# Patient Record
Sex: Male | Born: 1965 | Hispanic: No | Marital: Married | Smoking: Never smoker
Health system: Southern US, Community
[De-identification: ages and names within clinical notes are randomized; demographics above are authoritative.]

## PROBLEM LIST (undated history)

## (undated) DIAGNOSIS — M1990 Unspecified osteoarthritis, unspecified site: Secondary | ICD-10-CM

## (undated) DIAGNOSIS — E785 Hyperlipidemia, unspecified: Secondary | ICD-10-CM

## (undated) DIAGNOSIS — N200 Calculus of kidney: Secondary | ICD-10-CM

## (undated) DIAGNOSIS — F32A Depression, unspecified: Secondary | ICD-10-CM

## (undated) DIAGNOSIS — I10 Essential (primary) hypertension: Secondary | ICD-10-CM

## (undated) DIAGNOSIS — K219 Gastro-esophageal reflux disease without esophagitis: Secondary | ICD-10-CM

## (undated) DIAGNOSIS — F419 Anxiety disorder, unspecified: Secondary | ICD-10-CM

## (undated) DIAGNOSIS — R12 Heartburn: Secondary | ICD-10-CM

## (undated) HISTORY — DX: Essential (primary) hypertension: I10

## (undated) HISTORY — DX: Calculus of kidney: N20.0

## (undated) HISTORY — DX: Depression, unspecified: F32.A

## (undated) HISTORY — DX: Hyperlipidemia, unspecified: E78.5

## (undated) HISTORY — DX: Anxiety disorder, unspecified: F41.9

## (undated) HISTORY — DX: Unspecified osteoarthritis, unspecified site: M19.90

## (undated) HISTORY — DX: Heartburn: R12

## (undated) HISTORY — DX: Gastro-esophageal reflux disease without esophagitis: K21.9

## (undated) HISTORY — PX: LITHOTRIPSY: SUR834

## (undated) HISTORY — PX: TONSILLECTOMY: SUR1361

---

## 2009-08-23 ENCOUNTER — Emergency Department (HOSPITAL_COMMUNITY): Admission: EM | Admit: 2009-08-23 | Discharge: 2009-08-23 | Payer: Self-pay | Admitting: Emergency Medicine

## 2009-08-28 ENCOUNTER — Ambulatory Visit (HOSPITAL_COMMUNITY): Admission: RE | Admit: 2009-08-28 | Discharge: 2009-08-28 | Payer: Self-pay | Admitting: Urology

## 2010-06-20 ENCOUNTER — Emergency Department (HOSPITAL_COMMUNITY): Admission: EM | Admit: 2010-06-20 | Discharge: 2010-06-20 | Payer: Self-pay | Admitting: Emergency Medicine

## 2010-10-20 LAB — DIFFERENTIAL
Eosinophils Relative: 6 % — ABNORMAL HIGH (ref 0–5)
Lymphocytes Relative: 24 % (ref 12–46)
Lymphs Abs: 2 10*3/uL (ref 0.7–4.0)
Monocytes Absolute: 0.7 10*3/uL (ref 0.1–1.0)
Monocytes Relative: 9 % (ref 3–12)

## 2010-10-20 LAB — URINALYSIS, ROUTINE W REFLEX MICROSCOPIC
Glucose, UA: NEGATIVE mg/dL
Nitrite: POSITIVE — AB
Specific Gravity, Urine: 1.024 (ref 1.005–1.030)
pH: 5.5 (ref 5.0–8.0)

## 2010-10-20 LAB — COMPREHENSIVE METABOLIC PANEL
AST: 26 U/L (ref 0–37)
Albumin: 4.3 g/dL (ref 3.5–5.2)
Calcium: 9.8 mg/dL (ref 8.4–10.5)
Creatinine, Ser: 1.53 mg/dL — ABNORMAL HIGH (ref 0.4–1.5)
GFR calc Af Amer: >60 mL/min (ref >60)
GFR calc non Af Amer: 50 mL/min — ABNORMAL LOW (ref >60)
Total Protein: 7.3 g/dL (ref 6.0–8.3)

## 2010-10-20 LAB — CBC
MCH: 32.3 pg (ref 26.0–34.0)
MCHC: 34.7 g/dL (ref 30.0–36.0)
Platelets: 336 10*3/uL (ref 150–400)
RDW: 13.6 % (ref 11.5–15.5)

## 2010-10-20 LAB — URINE MICROSCOPIC-ADD ON

## 2010-10-20 LAB — LIPASE, BLOOD: Lipase: 39 U/L (ref 11–59)

## 2010-10-25 LAB — URINALYSIS, ROUTINE W REFLEX MICROSCOPIC
Glucose, UA: NEGATIVE mg/dL
Protein, ur: 100 mg/dL — AB

## 2010-10-25 LAB — URINE MICROSCOPIC-ADD ON

## 2016-08-09 HISTORY — PX: TUMOR REMOVAL: SHX12

## 2017-05-16 NOTE — Progress Notes (Signed)
05/17/2017 3:04 PM   Laurice Record Mcquaig June 12, 1966 [id number]  Referring provider: No referring provider defined for this encounter.  Chief Complaint  Patient presents with  . New Patient (Initial Visit)    Kidney stone referred by Insurance provider list    HPI: Patient is a 51- year old Caucasian male who presents today for nephrolithiasis with girlfriend, [place].    He was having gross hematuria one month ago and was placed on tamsulosin.  He then started having pain in the left back on and off over the weekend.  On Sunday, it became unbearable and he was seen in the ED.  The pain was a 8-9/10.  Nothing made the pain better.  Nothing made the pain worse.  He is not having fevers or chills.  He has had nausea or vomiting.  CT Renal stone study performed at [facility] on 05/15/2017 noted an obstructing 5 mm calculus in the distal left ureter with proximal mild to moderate hydroureteronephrosis as well as associated periureteral and perinephric inflammation  Today, he is having pain in the left flank that is radiating to the left groin.  He feels like "something is trying to get out."    He is having frequency, urgency, dysuria, nocturia, intermittency, hesitancy, straining to urinate, gross hematuria and a weak urinary stream.    UA today is positive for 11-30 RBC's.    He does have a prior history of stones.   He underwent ESWL in 2010.       Reviewed referral notes.    PMH: Past Medical History:  Diagnosis Date  . Heartburn   . HLD (hyperlipidemia)   . HTN (hypertension)   . Kidney stone     Surgical History: Past Surgical History:  Procedure Laterality Date  . LITHOTRIPSY    . TONSILLECTOMY      Home Medications:  Allergies as of 05/17/2017   No Known Allergies     Medication List       Accurate as of 05/17/17  3:04 PM. Always use your most recent med list.          aspirin 81 MG chewable tablet Chew by mouth daily.   cholecalciferol 400 units  Tabs tablet Commonly known as:  VITAMIN D Take 400 Units by mouth once a week.   ondansetron 4 MG tablet Commonly known as:  ZOFRAN Take 4 mg by mouth every 8 (eight) hours as needed for nausea or vomiting.   oxycodone 5 MG capsule Commonly known as:  OXY-IR Take 1 capsule (5 mg total) by mouth every 6 (six) hours as needed.   oxyCODONE-acetaminophen 10-325 MG tablet Commonly known as:  PERCOCET Take 1 tablet by mouth every 4 (four) hours as needed for pain.   simvastatin 10 MG tablet Commonly known as:  ZOCOR Take 10 mg by mouth daily.   tamsulosin 0.4 MG Caps capsule Commonly known as:  FLOMAX Take 1 capsule (0.4 mg total) by mouth daily.       Allergies: No Known Allergies  Family History: Family History  Problem Relation Age of Onset  . Kidney cancer Sister   . Bladder Cancer Neg Hx   . Prostate cancer Neg Hx     Social History:  reports that he has never smoked. He has quit using smokeless tobacco. His smokeless tobacco use included Chew. He reports that he drinks alcohol. He reports that he does not use drugs.  ROS: UROLOGY Frequent Urination?: Yes Hard to postpone urination?: Yes  Burning/pain with urination?: Yes Get up at night to urinate?: Yes Leakage of urine?: No Urine stream starts and stops?: Yes Trouble starting stream?: Yes Do you have to strain to urinate?: Yes Blood in urine?: Yes Urinary tract infection?: No Sexually transmitted disease?: No Injury to kidneys or bladder?: No Painful intercourse?: No Weak stream?: Yes Erection problems?: No Penile pain?: Yes  Gastrointestinal Nausea?: Yes Vomiting?: Yes Indigestion/heartburn?: Yes Diarrhea?: No Constipation?: Yes  Constitutional Fever: Yes Night sweats?: No Weight loss?: No Fatigue?: Yes  Skin Skin rash/lesions?: No Itching?: Yes  Eyes Blurred vision?: Yes Double vision?: No  Ears/Nose/Throat Sore throat?: No Sinus problems?: No  Hematologic/Lymphatic Swollen  glands?: No Easy bruising?: Yes  Cardiovascular Leg swelling?: No Chest pain?: No  Respiratory Cough?: No Shortness of breath?: No  Endocrine Excessive thirst?: No  Musculoskeletal Back pain?: Yes Joint pain?: Yes  Neurological Headaches?: Yes Dizziness?: Yes  Psychologic Depression?: No Anxiety?: No  Physical Exam: BP 133/86   Pulse 62   Temp 98.1 F (36.7 C) (Oral)   Ht '5\' 7"'$  (1.702 m)   Wt 187 lb 11.2 oz (85.1 kg)   BMI 29.40 kg/m   Constitutional: Well nourished. Alert and oriented, No acute distress. HEENT: [place] AT, moist mucus membranes. Trachea midline, no masses. Cardiovascular: No clubbing, cyanosis, or edema. Respiratory: Normal respiratory effort, no increased work of breathing. GI: Abdomen is soft, non tender, non distended, no abdominal masses. Liver and spleen not palpable.  No hernias appreciated.  Stool sample for occult testing is not indicated.   GU: No CVA tenderness.  No bladder fullness or masses.   Skin: No rashes, bruises or suspicious lesions. Lymph: No cervical or inguinal adenopathy. Neurologic: Grossly intact, no focal deficits, moving all 4 extremities. Psychiatric: Normal mood and affect.  Laboratory Data: Urinalysis 11-30 RBC's.  See EPIC.    I have reviewed the labs.   Pertinent Imaging: Name: GEORGIE HAQUE  ID #: [id number]    Exam: CT ABDOMEN PELVIS WO CONTRAST At 3220 Hours  Acc#: [id number] UN   Exam Date: [id number]  Physician: SELF REFERRED     EXAM: CT abdomen and pelvis without contrast DATE: 05/15/2017 4:16 PM ACCESSION: [id number] UN DICTATED: 05/15/2017 4:28 PM INTERPRETATION LOCATION: [place]  CLINICAL INDICATION: 51 years old Male with CALCULUS OF URETER--   COMPARISON: None  TECHNIQUE: A spiral CT scan was obtained without IV contrast from the top of the kidneys through the entire bladder. Images were reconstructed in the axial plane. Coronal and sagittal reformatted images were also provided for  further evaluation.  FINDINGS:Evaluation of the solid organs and vasculature is limited in the absence of intravenous contrast.   LOWER CHEST: Bibasilar subsegmental atelectasis. Coronary artery calcifications..  ABDOMEN/PELVIS:  HEPATOBILIARY: Unremarkable liver. No biliary ductal dilatation. Gallbladder is markedly. PANCREAS: Unremarkable. SPLEEN: Unremarkable. ADRENAL GLANDS: Unremarkable. KIDNEYS/URETERS: 5 mm obstructing calculus in the distal left ureter with proximal mild to moderate hydroureteronephrosis with perinephric and periureteral stranding. There is mild right perinephric stranding without hydronephrosis or calculi.Marland Kitchen BLADDER: Unremarkable. BOWEL/PERITONEUM/RETROPERITONEUM: No bowel obstruction. No ascites. Normal appendix. VASCULATURE: Abdominal aorta within normal limits for patient's age. Unremarkable inferior vena cava. Pelvic phleboliths. Scattered atherosclerotic calcifications. LYMPH NODES: No adenopathy. REPRODUCTIVE ORGANS: Prostatic calcification.  BONES/SOFT TISSUES: No acute osseous abnormality. Tiny left fat-containing hernia.   IMPRESSION: -- Obstructing 5 mm calculus in the distal left ureter with proximal mild to moderate hydroureteronephrosis as well as associated periureteral and perinephric inflammation.      Radiologist: Orlene Erm  I have independently reviewed the films.    Assessment & Plan:    1. Left ureteral stone  - explained to the patient that AUA Guidelines for patients with uncomplicated ureteral stones ?10 mm should be offered observation, and those with distal stones of similar size should be offered MET with ?-blockers - Flomax refilled  - if after 4 to 6 weeks observation with or without MET is not successful we would pursue URS or ESWL, if the patient/clinician decide to intervene sooner based on a shared decision making approach, the clinicians should offer definitive stone treatment  -URS would be the first lined  therapy if stone(s) do not pass, but ESWL is the procedure with the least morbidity and lowest complication rate, but URS has a greater stone-free rate in a single procedure  - Patient had a friend who underwent URS and did not do well and therefore he would like to pursue ESWL as he has had in the pass  - schedule left ESWL  - Patient advised not to take any further NSAIDs or aspirin  - I explained that ESWL is a means of pulverizing urinary stones without surgery using shockwave therapy  - I discussed the risks involved with ESWL consist of bruising to the skin and kidney region as a result of the shockwave, possibility of long-term kidney damage, development of high blood pressure and damage to the bowel or long, hematuria, urinary bleeding serious enough to require transfusion or surgical repair or removal of the kidney is rare, rare chance of hematoma formation in the kidney and injuries to the spleen, liver or pancreas.  There is also the risk of urinary tract infection or any infection of the blood system or tissue.   There is also a possibility of resultant damage to male organs.  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  - Advised to contact our office or seek treatment in the ED if becomes febrile or pain/ vomiting are difficult control in order to arrange for emergent/urgent intervention  2. Left hydronephrosis  - obtain RUS to ensure the hydronephrosis has resolved.    3. Gross hematuria  - UA today demonstrates 11-30 RBC's  - continue to monitor the patient's UA after the  treatment/passage of the stone to ensure the hematuria has resolved  - if hematuria persists, we will pursue a hematuria workup with CT Urogram and cystoscopy if appropriate.  4. Left flank pain  - given script for oxycodone 5 mg IR, # 10 - checked the [place] controlled substance registry - patient had #12 oxycodone 5 mg IR prescribed on 05/15/2017 by Dr. Jadene Pierini - patient advised this is a narcotic and that it is a potential he didn't take his and he can build tolerance up to the medication, he is not to drive, operate heavy machinery or make left changing decisions while taking this medication     Return for left ESWL.  These notes generated with voice recognition software. I apologize for typographical errors.  Zara Council, [facility] [address], [place], [place] [address] [phone]  [phone]

## 2017-05-17 ENCOUNTER — Encounter: Payer: Self-pay | Admitting: Urology

## 2017-05-17 ENCOUNTER — Other Ambulatory Visit: Payer: Self-pay

## 2017-05-17 ENCOUNTER — Ambulatory Visit (INDEPENDENT_AMBULATORY_CARE_PROVIDER_SITE_OTHER): Payer: 59 | Admitting: Urology

## 2017-05-17 VITALS — BP 133/86 | HR 62 | Temp 98.1°F | Ht 67.0 in | Wt 187.7 lb

## 2017-05-17 DIAGNOSIS — N201 Calculus of ureter: Secondary | ICD-10-CM

## 2017-05-17 DIAGNOSIS — R109 Unspecified abdominal pain: Secondary | ICD-10-CM | POA: Diagnosis not present

## 2017-05-17 DIAGNOSIS — R310 Gross hematuria: Secondary | ICD-10-CM | POA: Diagnosis not present

## 2017-05-17 DIAGNOSIS — N132 Hydronephrosis with renal and ureteral calculous obstruction: Secondary | ICD-10-CM | POA: Diagnosis not present

## 2017-05-17 LAB — URINALYSIS, COMPLETE
Bilirubin, UA: NEGATIVE
GLUCOSE, UA: NEGATIVE
Leukocytes, UA: NEGATIVE
NITRITE UA: NEGATIVE
PH UA: 6 (ref 5.0–7.5)
Specific Gravity, UA: 1.025 (ref 1.005–1.030)
UUROB: 0.2 mg/dL (ref 0.2–1.0)

## 2017-05-17 LAB — MICROSCOPIC EXAMINATION
BACTERIA UA: NONE SEEN
EPITHELIAL CELLS (NON RENAL): NONE SEEN /HPF (ref 0–10)

## 2017-05-17 MED ORDER — OXYCODONE HCL 5 MG PO CAPS: 5.0000 mg | ORAL_CAPSULE | Freq: Four times a day (QID) | ORAL | 0 refills | Status: DC | PRN

## 2017-05-17 MED ORDER — TAMSULOSIN HCL 0.4 MG PO CAPS: 0.4000 mg | ORAL_CAPSULE | Freq: Every day | ORAL | 0 refills | Status: DC

## 2017-05-18 LAB — CBC WITH DIFFERENTIAL/PLATELET
BASOS ABS: 0 10*3/uL (ref 0.0–0.2)
BASOS: 0 %
EOS (ABSOLUTE): 0.1 10*3/uL (ref 0.0–0.4)
EOS: 1 %
Hematocrit: 36.9 % — ABNORMAL LOW (ref 37.5–51.0)
Hemoglobin: 12.9 g/dL — ABNORMAL LOW (ref 13.0–17.7)
IMMATURE GRANS (ABS): 0 10*3/uL (ref 0.0–0.1)
Immature Granulocytes: 0 %
LYMPHS: 9 %
Lymphocytes Absolute: 0.8 10*3/uL (ref 0.7–3.1)
MCH: 30.7 pg (ref 26.6–33.0)
MCHC: 35 g/dL (ref 31.5–35.7)
MCV: 88 fL (ref 79–97)
MONOS ABS: 0.6 10*3/uL (ref 0.1–0.9)
Monocytes: 7 %
NEUTROS PCT: 83 %
Neutrophils Absolute: 7.8 10*3/uL — ABNORMAL HIGH (ref 1.4–7.0)
PLATELETS: 260 10*3/uL (ref 150–379)
RBC: 4.2 x10E6/uL (ref 4.14–5.80)
RDW: 13.1 % (ref 12.3–15.4)
WBC: 9.4 10*3/uL (ref 3.4–10.8)

## 2017-05-18 LAB — BASIC METABOLIC PANEL
BUN / CREAT RATIO: 9 (ref 9–20)
BUN: 15 mg/dL (ref 6–24)
CHLORIDE: 99 mmol/L (ref 96–106)
CO2: 24 mmol/L (ref 20–29)
Calcium: 8.5 mg/dL — ABNORMAL LOW (ref 8.7–10.2)
Creatinine, Ser: 1.7 mg/dL — ABNORMAL HIGH (ref 0.76–1.27)
GFR calc Af Amer: 53 mL/min/{1.73_m2} — ABNORMAL LOW (ref >59)
GFR calc non Af Amer: 46 mL/min/{1.73_m2} — ABNORMAL LOW (ref >59)
GLUCOSE: 89 mg/dL (ref 65–99)
POTASSIUM: 3.9 mmol/L (ref 3.5–5.2)
SODIUM: 138 mmol/L (ref 134–144)

## 2017-05-19 SURGERY — Surgical Case
Anesthesia: *Unknown

## 2017-05-20 LAB — CULTURE, URINE COMPREHENSIVE

## 2017-05-25 MED ORDER — CIPROFLOXACIN HCL 500 MG PO TABS
500.0000 mg | ORAL_TABLET | ORAL | Status: DC
Start: 2017-05-25 — End: 2017-05-28

## 2017-05-26 ENCOUNTER — Encounter: Payer: Self-pay | Admitting: *Deleted

## 2017-05-26 ENCOUNTER — Encounter
Admission: RE | Disposition: A | Discharge status: Home / Self Care | Payer: Self-pay | Source: Ambulatory Visit | Attending: Urology

## 2017-05-26 ENCOUNTER — Ambulatory Visit
Admission: RE | Admit: 2017-05-26 | Discharge: 2017-05-26 | Disposition: A | Discharge status: Home / Self Care | Payer: 59 | Source: Ambulatory Visit | Attending: Urology | Admitting: Urology

## 2017-05-26 ENCOUNTER — Ambulatory Visit: Payer: 59

## 2017-05-26 DIAGNOSIS — N200 Calculus of kidney: Secondary | ICD-10-CM | POA: Diagnosis present

## 2017-05-26 DIAGNOSIS — Z538 Procedure and treatment not carried out for other reasons: Secondary | ICD-10-CM | POA: Diagnosis not present

## 2017-05-26 SURGERY — LITHOTRIPSY, ESWL
Anesthesia: Moderate Sedation | Laterality: Left

## 2017-05-26 MED ORDER — SODIUM CHLORIDE 0.9 % IV SOLN: INTRAVENOUS | Status: DC

## 2017-05-26 MED ORDER — DIPHENHYDRAMINE HCL 25 MG PO CAPS: 25.0000 mg | ORAL_CAPSULE | ORAL | Status: AC

## 2017-05-26 MED ORDER — DIAZEPAM 5 MG PO TABS: 10.0000 mg | ORAL_TABLET | ORAL | Status: AC

## 2017-05-26 MED ORDER — ONDANSETRON HCL 4 MG/2ML IJ SOLN: 4.0000 mg | Freq: Once | INTRAMUSCULAR | Status: DC

## 2017-05-26 NOTE — Progress Notes (Signed)
Patient passed stone yesterday evening which he brought with him to preop this AM.  KUB negative, compared to CT scan at [facility]    Case cancelled as such.    Hollice Espy, MD

## 2017-06-14 ENCOUNTER — Other Ambulatory Visit: Payer: Self-pay | Admitting: Urology

## 2017-06-20 ENCOUNTER — Telehealth: Payer: Self-pay | Admitting: Urology

## 2017-06-20 DIAGNOSIS — N200 Calculus of kidney: Secondary | ICD-10-CM

## 2017-06-20 NOTE — Telephone Encounter (Signed)
Would you let Harold Morgan know that his stone analysis was positive for 22% calcium oxalate monohydrate, 63% calcium oxalate dihydrate and 50% calcium phosphate carbonate stone?  A basic prevention plan for kidney stones would be to drink 2.5 L of water daily, avoid sodas, reduce protein and salt in his diet and increase citrate juices in his diet.  If he would like a more comprehensive prevention plan, we could perform a 24 hour metabolic work up.    We also need to obtain a RUS to ensure his kidney has healed from the passage of the stone.  Even if he is not having pain, his kidney may still be swollen causing kidney damage.  We should also recheck an UA to make sure the blood in the urine has cleared since passing the stone.    If the blood in the urine persists, it may be a sign of kidney and/or bladder cancer.

## 2017-06-22 NOTE — Telephone Encounter (Signed)
Spoke with pt in reference to stone analysis results, diet modifications, 24hr urine, RUS, and if he sees blood in his urine again. Pt voiced understanding of whole conversation. RUS orders placed.

## 2017-07-15 ENCOUNTER — Ambulatory Visit
Admission: RE | Admit: 2017-07-15 | Discharge: 2017-07-15 | Disposition: A | Discharge status: Home / Self Care | Payer: 59 | Source: Ambulatory Visit | Attending: Urology | Admitting: Urology

## 2017-07-15 DIAGNOSIS — N200 Calculus of kidney: Secondary | ICD-10-CM | POA: Diagnosis present

## 2017-07-19 ENCOUNTER — Telehealth: Payer: Self-pay

## 2017-07-19 NOTE — Telephone Encounter (Signed)
Will send a letter

## 2017-07-19 NOTE — Telephone Encounter (Signed)
-----   Message from Nori Riis, PA-C sent at 07/18/2017  9:45 AM EST ----- Please let Nefi know that his RUS was normal.  We need to check his urine microscopically for blood, so he needs a lab visit for an UA.

## 2017-10-11 ENCOUNTER — Ambulatory Visit: Payer: Self-pay | Admitting: Family Medicine

## 2017-10-17 ENCOUNTER — Ambulatory Visit (INDEPENDENT_AMBULATORY_CARE_PROVIDER_SITE_OTHER): Payer: Managed Care, Other (non HMO) | Admitting: Family Medicine

## 2017-10-17 ENCOUNTER — Encounter: Payer: Self-pay | Admitting: Family Medicine

## 2017-10-17 VITALS — BP 128/80 | HR 61 | Ht 67.0 in | Wt 186.5 lb

## 2017-10-17 DIAGNOSIS — R51 Headache: Secondary | ICD-10-CM | POA: Diagnosis not present

## 2017-10-17 DIAGNOSIS — G441 Vascular headache, not elsewhere classified: Secondary | ICD-10-CM | POA: Diagnosis not present

## 2017-10-17 DIAGNOSIS — D492 Neoplasm of unspecified behavior of bone, soft tissue, and skin: Secondary | ICD-10-CM | POA: Insufficient documentation

## 2017-10-17 DIAGNOSIS — S76912A Strain of unspecified muscles, fascia and tendons at thigh level, left thigh, initial encounter: Secondary | ICD-10-CM

## 2017-10-17 DIAGNOSIS — R519 Headache, unspecified: Secondary | ICD-10-CM | POA: Insufficient documentation

## 2017-10-17 DIAGNOSIS — Z0000 Encounter for general adult medical examination without abnormal findings: Secondary | ICD-10-CM | POA: Diagnosis not present

## 2017-10-17 HISTORY — DX: Neoplasm of unspecified behavior of bone, soft tissue, and skin: D49.2

## 2017-10-17 HISTORY — DX: Headache, unspecified: R51.9

## 2017-10-17 NOTE — Progress Notes (Addendum)
Subjective:  Patient ID: Harold Morgan, male    DOB: 02-Aug-1966  Age: 52 y.o. MRN: [id number]  CC: Establish Care   HPI Harold Morgan presents for evaluation of headache and left posterior thigh pain.  Patient is a 1-2-year history of a nonprogressive headache occurring bitemporally and behind both of his eyes.  The headaches can last throughout the day and they are treated with a wet rag, Excedrin Migraine and resting in a dark room.  He does feel lightheaded with the headaches and has to lie down.  He has no personal history of headaches.  His mother suffered from ocular migraines.  Patient experiences sinus problems on occasion.  None recently.  He had fractured his nose playing football years ago.  Patient tells of ongoing a 3 year history of pain in his lateral left distal  hamstring.  He has no injury history here.  This area can be exquisitely tender to touch.  He has had no problems with his knee.  No injury to the knee.  No locking or giving way.  Patient does not smoke.  He rarely drinks alcohol.  He does not use illicit drugs.  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  He has a history of kidney stones with acute kidney injury back in October of this year.  He had an EKG at some point in with and was told that his heart was enlarged.  He has no exertional chest pain or shortness of breath.  His father died in his early 32s from COPD he was exposed to cotton dust.  His mother is 80 and has COPD and what is believed to be early stage Alzheimer's disease.  History Harold Morgan has a past medical history of Heartburn, HLD (hyperlipidemia), HTN (hypertension), and Kidney stone.   He has a past surgical history that includes Lithotripsy and Tonsillectomy.   His family history includes Kidney cancer in his sister.He reports that  has never smoked. He has quit using smokeless tobacco. His smokeless tobacco use included chew. He reports that he drinks alcohol. He reports  that he does not use drugs.  Outpatient Medications Prior to Visit  Medication Sig Dispense Refill  . rosuvastatin (CRESTOR) 40 MG tablet Take 40 mg by mouth daily.  5  . aspirin 81 MG chewable tablet Chew by mouth daily.    . cholecalciferol (VITAMIN D) 400 units TABS tablet Take 400 Units by mouth once a week.    . ondansetron (ZOFRAN) 4 MG tablet Take 4 mg by mouth every 8 (eight) hours as needed for nausea or vomiting.    Marland Kitchen oxycodone (OXY-IR) 5 MG capsule Take 1 capsule (5 mg total) by mouth every 6 (six) hours as needed. 10 capsule 0  . oxyCODONE-acetaminophen (PERCOCET) 10-325 MG tablet Take 1 tablet by mouth every 4 (four) hours as needed for pain.    . simvastatin (ZOCOR) 10 MG tablet Take 10 mg by mouth daily.    . tamsulosin (FLOMAX) 0.4 MG CAPS capsule Take 1 capsule (0.4 mg total) by mouth daily. 30 capsule 0   No facility-administered medications prior to visit.     ROS Review of Systems  Constitutional: Negative.  Negative for chills, fatigue and unexpected weight change.  HENT: Negative.   Eyes: Negative for photophobia and visual disturbance.  Respiratory: Negative for cough, shortness of breath and wheezing.   Cardiovascular: Negative.   Gastrointestinal: Negative.   Endocrine: Negative for polyphagia  and polyuria.  Genitourinary: Negative.   Musculoskeletal: Positive for myalgias. Negative for joint swelling.  Skin: Negative.   Allergic/Immunologic: Negative for immunocompromised state.  Neurological: Positive for light-headedness and headaches. Negative for weakness and numbness.  Hematological: Does not bruise/bleed easily.  Psychiatric/Behavioral: Negative.     Objective:  BP 128/80 (BP Location: Right Arm, Patient Position: Sitting, Cuff Size: Normal)   Pulse 61   Ht 5\' 7"  (1.702 m)   Wt 186 lb 8 oz (84.6 kg)   SpO2 98%   BMI 29.21 kg/m   Physical Exam  Constitutional: He is oriented to person, place, and time. He appears well-developed and  well-nourished. No distress.  HENT:  Head: Normocephalic and atraumatic.  Right Ear: External ear normal.  Left Ear: External ear normal.  Mouth/Throat: Oropharynx is clear and moist. No oropharyngeal exudate.  Eyes: Conjunctivae and EOM are normal. Right eye exhibits no discharge. Left eye exhibits no discharge. No scleral icterus.  Fundoscopic exam:      The right eye shows no exudate, no hemorrhage and no papilledema.       The left eye shows no exudate, no hemorrhage and no papilledema.  Neck: Normal range of motion. Neck supple. No JVD present. No tracheal deviation present. No thyromegaly present.  Pulmonary/Chest: Effort normal. No stridor.  Musculoskeletal:       Left knee: He exhibits normal range of motion, no swelling and no effusion. No tenderness found. No medial joint line and no lateral joint line tenderness noted.       Left upper leg: He exhibits tenderness. He exhibits no deformity.       Legs: Lymphadenopathy:    He has no cervical adenopathy.  Neurological: He is alert and oriented to person, place, and time. No cranial nerve deficit. Coordination normal.  Skin: Skin is warm and dry. He is not diaphoretic.  Psychiatric: He has a normal mood and affect. His behavior is normal.      Assessment & Plan:   Harold Morgan was seen today for establish care.  Diagnoses and all orders for this visit:  Nonintractable episodic headache, unspecified headache type  Muscle strain of left thigh, initial encounter -     Ambulatory referral to [place] maintenance -     Cancel: CBC; Future -     Cancel: Comprehensive metabolic panel; Future -     Cancel: HIV antibody; Future -     Cancel: Lipid panel; Future -     Cancel: PSA; Future -     Cancel: Urinalysis, Routine w reflex microscopic; Future -     Urinalysis, Routine w reflex microscopic; Future -     PSA; Future -     Lipid panel; Future -     HIV antibody; Future -     Comprehensive metabolic panel;  Future -     CBC; Future  Other vascular headache -     CT Head Wo Contrast; Future   I have discontinued Harold Morgan's oxyCODONE-acetaminophen, ondansetron, aspirin, simvastatin, cholecalciferol, oxycodone, and tamsulosin. I am also having him maintain his rosuvastatin.  No orders of the defined types were placed in this encounter.    Follow-up: Return in about 3 months (around 01/17/2018), or if symptoms worsen or fail to improve.  Libby Maw, MD

## 2017-10-21 ENCOUNTER — Other Ambulatory Visit: Payer: Managed Care, Other (non HMO)

## 2017-10-24 ENCOUNTER — Ambulatory Visit (INDEPENDENT_AMBULATORY_CARE_PROVIDER_SITE_OTHER): Payer: Managed Care, Other (non HMO)

## 2017-10-24 ENCOUNTER — Other Ambulatory Visit (INDEPENDENT_AMBULATORY_CARE_PROVIDER_SITE_OTHER): Payer: Managed Care, Other (non HMO)

## 2017-10-24 ENCOUNTER — Other Ambulatory Visit: Payer: Managed Care, Other (non HMO)

## 2017-10-24 ENCOUNTER — Ambulatory Visit (INDEPENDENT_AMBULATORY_CARE_PROVIDER_SITE_OTHER)
Admission: RE | Admit: 2017-10-24 | Discharge: 2017-10-24 | Disposition: A | Discharge status: Home / Self Care | Payer: Managed Care, Other (non HMO) | Source: Ambulatory Visit | Attending: Family Medicine | Admitting: Family Medicine

## 2017-10-24 ENCOUNTER — Encounter: Payer: Self-pay | Admitting: Family Medicine

## 2017-10-24 ENCOUNTER — Ambulatory Visit (INDEPENDENT_AMBULATORY_CARE_PROVIDER_SITE_OTHER): Payer: Managed Care, Other (non HMO) | Admitting: Family Medicine

## 2017-10-24 VITALS — BP 128/64 | HR 61 | Temp 98.3°F | Ht 67.0 in | Wt 183.0 lb

## 2017-10-24 DIAGNOSIS — G441 Vascular headache, not elsewhere classified: Secondary | ICD-10-CM | POA: Diagnosis not present

## 2017-10-24 DIAGNOSIS — M79605 Pain in left leg: Secondary | ICD-10-CM | POA: Diagnosis not present

## 2017-10-24 DIAGNOSIS — R936 Abnormal findings on diagnostic imaging of limbs: Secondary | ICD-10-CM

## 2017-10-24 DIAGNOSIS — Z0000 Encounter for general adult medical examination without abnormal findings: Secondary | ICD-10-CM

## 2017-10-24 HISTORY — DX: Pain in left leg: M79.605

## 2017-10-24 NOTE — Addendum Note (Signed)
Addended by: Lynnea Ferrier on: 10/24/2017 07:54 AM   Modules accepted: Orders

## 2017-10-24 NOTE — Progress Notes (Addendum)
Harold Morgan - 52 y.o. male MRN [id number]  Date of birth: [birth year]  SUBJECTIVE:  Including CC & ROS.  Chief Complaint  Patient presents with  . Left leg and hand pain    Harold Morgan is a 52 y.o. male that is presenting with left leg pain. Pain has been ongoing for over three years. Pain is located on the posterior thigh on the lateral aspect near the distal aspect of biceps femoris. Described as an intermittent stabbing pain. Denies certain movements that trigger the pain. He has been taking tylenols for the pain. Denies swelling. Denies injury or surgeries. 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      Review of Systems  Constitutional: Negative for fever.  HENT: Negative for congestion.   Respiratory: Negative for cough.   Cardiovascular: Negative for chest pain.  Gastrointestinal: Negative for abdominal pain.  Musculoskeletal: Negative for gait problem.  Skin: Negative for color change.  Allergic/Immunologic: Negative for immunocompromised state.  Neurological: Negative for weakness.  Hematological: Negative for adenopathy.  Psychiatric/Behavioral: Negative for agitation.    HISTORY: Past Medical, Surgical, Social, and Family History Reviewed & Updated per EMR.   Pertinent Historical Findings include:  Past Medical History:  Diagnosis Date  . Heartburn   . HLD (hyperlipidemia)   . HTN (hypertension)   . Kidney stone     Past Surgical History:  Procedure Laterality Date  . LITHOTRIPSY    . TONSILLECTOMY      No Known Allergies  Family History  Problem Relation Age of Onset  . Kidney cancer Sister   . Bladder Cancer Neg Hx   . Prostate cancer Neg Hx      Social History   Socioeconomic History  . Marital status: Divorced    Spouse name: Not on file  . Number of children: Not on file  . Years of education: Not on file  . Highest education level: Not on file  Social Needs  .  Financial resource strain: Not on file  . Food insecurity - worry: Not on file  . Food insecurity - inability: Not on file  . Transportation needs - medical: Not on file  . Transportation needs - non-medical: Not on file  Occupational History  . Not on file  Tobacco Use  . Smoking status: Never Smoker  . Smokeless tobacco: Former Systems developer    Types: Chew  Substance and Sexual Activity  . Alcohol use: Yes  . Drug use: No  . Sexual activity: Not on file  Other Topics Concern  . Not on file  Social History Narrative  . Not on file     PHYSICAL EXAM:  VS: BP 128/64 (BP Location: Left Arm, Patient Position: Sitting, Cuff Size: Normal)   Pulse 61   Temp 98.3 F (36.8 C) (Oral)   Ht 5\' 7"  (1.702 m)   Wt 183 lb (83 kg)   SpO2 98%   BMI 28.66 kg/m  Physical Exam Gen: NAD, alert, cooperative with exam, well-appearing ENT: normal lips, normal nasal mucosa,  Eye: normal EOM, normal conjunctiva and lids CV:  no edema, +2 pedal pulses   Resp: no accessory muscle use, non-labored,  Skin: no rashes, no areas of induration  Neuro: normal tone, normal sensation to touch Psych:  normal insight, alert and oriented MSK:  Left thigh/knee:  No effusion  No TTP of the medial or lateral joint line.  TTP  of the biceps femoris just proximally to the knee.  No abnormality of posterior thigh  Normal gait  Normal knee extension and flexion  Normal strength to resistance with knee extension  Pain reproduced with resistance knee flexion  Normal thessaly test  Neurovascularly intact   Limited ultrasound: left thigh:  We will demarcated structure within the soft tissue of what appears to be the biceps femoris.  Roughly 1 cm x 1 cm circular mass.  This structure has a vascular supply and does not appear to be invading surrounding tissue.   Summary: soft tissue mass  Ultrasound and interpretation by Clearance Coots, MD         ASSESSMENT & PLAN:   Left leg pain He has a soft tissue  mass in the area that he appreciates the pain. Have concern that this may be a cancerous in nature.  - xray femur  - MRI w and without to evaluate soft tissue mass and rule out cancer.

## 2017-10-24 NOTE — Patient Instructions (Signed)
We will call you with the results from today.  

## 2017-10-24 NOTE — Assessment & Plan Note (Signed)
He has a soft tissue mass in the area that he appreciates the pain. Have concern that this may be a cancerous in nature.  - xray femur  - MRI w and without to evaluate soft tissue mass and rule out cancer.

## 2017-10-25 LAB — COMPREHENSIVE METABOLIC PANEL
ALT: 27 IU/L (ref 0–44)
AST: 19 IU/L (ref 0–40)
Albumin/Globulin Ratio: 1.9 (ref 1.2–2.2)
Albumin: 4.3 g/dL (ref 3.5–5.5)
Alkaline Phosphatase: 47 IU/L (ref 39–117)
BUN/Creatinine Ratio: 11 (ref 9–20)
BUN: 12 mg/dL (ref 6–24)
Bilirubin Total: 0.5 mg/dL (ref 0.0–1.2)
CO2: 25 mmol/L (ref 20–29)
Calcium: 8.9 mg/dL (ref 8.7–10.2)
Chloride: 102 mmol/L (ref 96–106)
Creatinine, Ser: 1.13 mg/dL (ref 0.76–1.27)
GFR calc non Af Amer: 75 mL/min/{1.73_m2} (ref >59)
GFR, EST AFRICAN AMERICAN: 87 mL/min/{1.73_m2} (ref >59)
GLUCOSE: 99 mg/dL (ref 65–99)
Globulin, Total: 2.3 g/dL (ref 1.5–4.5)
Potassium: 4.3 mmol/L (ref 3.5–5.2)
Sodium: 145 mmol/L — ABNORMAL HIGH (ref 134–144)
TOTAL PROTEIN: 6.6 g/dL (ref 6.0–8.5)

## 2017-10-25 LAB — CBC
HEMATOCRIT: 41.1 % (ref 37.5–51.0)
HEMOGLOBIN: 14.2 g/dL (ref 13.0–17.7)
MCH: 31.4 pg (ref 26.6–33.0)
MCHC: 34.5 g/dL (ref 31.5–35.7)
MCV: 91 fL (ref 79–97)
Platelets: 328 10*3/uL (ref 150–379)
RBC: 4.52 x10E6/uL (ref 4.14–5.80)
RDW: 13.7 % (ref 12.3–15.4)
WBC: 5.1 10*3/uL (ref 3.4–10.8)

## 2017-10-25 LAB — URINALYSIS, ROUTINE W REFLEX MICROSCOPIC
Bilirubin, UA: NEGATIVE
Glucose, UA: NEGATIVE
Ketones, UA: NEGATIVE
LEUKOCYTES UA: NEGATIVE
Nitrite, UA: NEGATIVE
PH UA: 7.5 (ref 5.0–7.5)
PROTEIN UA: NEGATIVE
RBC, UA: NEGATIVE
Specific Gravity, UA: 1.018 (ref 1.005–1.030)
UUROB: 0.2 mg/dL (ref 0.2–1.0)

## 2017-10-25 LAB — LIPID PANEL
CHOL/HDL RATIO: 5.7 ratio — AB (ref 0.0–5.0)
CHOLESTEROL TOTAL: 247 mg/dL — AB (ref 100–199)
HDL: 43 mg/dL (ref >39)
LDL CALC: 182 mg/dL — AB (ref 0–99)
TRIGLYCERIDES: 108 mg/dL (ref 0–149)
VLDL Cholesterol Cal: 22 mg/dL (ref 5–40)

## 2017-10-25 LAB — HIV ANTIBODY (ROUTINE TESTING W REFLEX): HIV Screen 4th Generation wRfx: NONREACTIVE

## 2017-10-25 LAB — PSA: Prostate Specific Ag, Serum: 0.4 ng/mL (ref 0.0–4.0)

## 2017-10-29 ENCOUNTER — Ambulatory Visit
Admission: RE | Admit: 2017-10-29 | Discharge: 2017-10-29 | Disposition: A | Discharge status: Home / Self Care | Payer: Managed Care, Other (non HMO) | Source: Ambulatory Visit | Attending: Family Medicine | Admitting: Family Medicine

## 2017-10-29 DIAGNOSIS — M79605 Pain in left leg: Secondary | ICD-10-CM

## 2017-10-29 MED ORDER — GADOBENATE DIMEGLUMINE 529 MG/ML IV SOLN
15.0000 mL | Freq: Once | INTRAVENOUS | Status: AC | PRN
  Administered 2017-10-29: 15 mL via INTRAVENOUS

## 2017-10-31 ENCOUNTER — Telehealth: Payer: Self-pay | Admitting: Family Medicine

## 2017-10-31 DIAGNOSIS — D492 Neoplasm of unspecified behavior of bone, soft tissue, and skin: Secondary | ICD-10-CM

## 2017-10-31 NOTE — Telephone Encounter (Signed)
Spoke with patient about his MRI results. Will refer him to [facility] to be evaluated by surgery.   Rosemarie Ax, MD [facility] 10/31/2017, 11:01 AM

## 2018-11-30 ENCOUNTER — Telehealth: Payer: Self-pay | Admitting: Family Medicine

## 2018-11-30 NOTE — Telephone Encounter (Signed)
Called patient on behalf of Dr Ethelene Hal because it has been since 3/19 since last appointment and Dr Ethelene Hal put for him to follow up in 3 months but one was never scheduled, I left a message asking if he would be interested to do a virtual visit for a follow up.

## 2020-01-29 ENCOUNTER — Other Ambulatory Visit: Payer: Self-pay

## 2020-01-30 ENCOUNTER — Ambulatory Visit: Payer: Managed Care, Other (non HMO) | Admitting: Nurse Practitioner

## 2020-01-31 ENCOUNTER — Other Ambulatory Visit: Payer: Self-pay

## 2020-01-31 ENCOUNTER — Encounter: Payer: Self-pay | Admitting: Family Medicine

## 2020-01-31 ENCOUNTER — Ambulatory Visit: Payer: Managed Care, Other (non HMO) | Admitting: Family Medicine

## 2020-01-31 ENCOUNTER — Ambulatory Visit (INDEPENDENT_AMBULATORY_CARE_PROVIDER_SITE_OTHER): Payer: Managed Care, Other (non HMO)

## 2020-01-31 VITALS — BP 140/92 | HR 66 | Temp 97.7°F | Ht 67.0 in | Wt 183.0 lb

## 2020-01-31 DIAGNOSIS — M25421 Effusion, right elbow: Secondary | ICD-10-CM | POA: Diagnosis not present

## 2020-01-31 DIAGNOSIS — M25521 Pain in right elbow: Secondary | ICD-10-CM

## 2020-01-31 MED ORDER — INDOMETHACIN 50 MG PO CAPS: 50.0000 mg | ORAL_CAPSULE | Freq: Three times a day (TID) | ORAL | 0 refills | Status: DC

## 2020-01-31 MED ORDER — ROSUVASTATIN CALCIUM 40 MG PO TABS: 40.0000 mg | ORAL_TABLET | Freq: Every day | ORAL | 3 refills | Status: DC

## 2020-01-31 NOTE — Progress Notes (Signed)
Harold Morgan is a 54 y.o. male  Chief Complaint  Patient presents with   Elbow Pain    Pt c/o having rt elbow swelling starting Saturday night.  Pt said that it is painful ,  pt has been using Ibuprofen for the pain.    HPI: Harold Morgan is a 54 y.o. male who is accompanied by his wife and complains of Rt elbow swelling and pain. He also notes warmth to touch. Symptoms started 5 days ago. ? Subjective fever 4 nights ago. No chills.  Naproxen, ibuprofen has helped some and symptoms have improved a little.  No h/o gout.  He notes for the past year or so if he leans on his Rt elbow it is painful and feels like "a little rock is in there".   Past Medical History:  Diagnosis Date   Heartburn    HLD (hyperlipidemia)    HTN (hypertension)    Kidney stone     Past Surgical History:  Procedure Laterality Date   LITHOTRIPSY     TONSILLECTOMY      Social History   Socioeconomic History   Marital status: Divorced    Spouse name: Not on file   Number of children: Not on file   Years of education: Not on file   Highest education level: Not on file  Occupational History   Not on file  Tobacco Use   Smoking status: Never Smoker   Smokeless tobacco: Former Systems developer    Types: Chew  Substance and Sexual Activity   Alcohol use: Yes   Drug use: No   Sexual activity: Not on file  Other Topics Concern   Not on file  Social History Narrative   Not on file   Social Determinants of Health   Financial Resource Strain:    Difficulty of Paying Living Expenses:   Food Insecurity:    Worried About Charity fundraiser in the Last Year:    Arboriculturist in the Last Year:   Transportation Needs:    Film/video editor (Medical):    Lack of Transportation (Non-Medical):   Physical Activity:    Days of Exercise per Week:    Minutes of Exercise per Session:   Stress:    Feeling of Stress :   Social Connections:    Frequency of Communication with  Friends and Family:    Frequency of Social Gatherings with Friends and Family:    Attends Religious Services:    Active Member of Clubs or Organizations:    Attends Music therapist:    Marital Status:   Intimate Partner Violence:    Fear of Current or Ex-Partner:    Emotionally Abused:    Physically Abused:    Sexually Abused:     Family History  Problem Relation Age of Onset   Kidney cancer Sister    Bladder Cancer Neg Hx    Prostate cancer Neg Hx       There is no immunization history on file for this patient.  Outpatient Encounter Medications as of 01/31/2020  Medication Sig   indomethacin (INDOCIN) 50 MG capsule Take 1 capsule (50 mg total) by mouth 3 (three) times daily with meals.   rosuvastatin (CRESTOR) 40 MG tablet Take 1 tablet (40 mg total) by mouth daily.   [DISCONTINUED] rosuvastatin (CRESTOR) 40 MG tablet Take 40 mg by mouth daily. (Patient not taking: Reported on 01/31/2020)   No facility-administered encounter medications on file as of  01/31/2020.     ROS: Pertinent positives and negatives noted in HPI. Remainder of ROS non-contributory   No Known Allergies  BP (!) 140/92 (BP Location: Left Arm, Patient Position: Sitting, Cuff Size: Normal)    Pulse 66    Temp 97.7 F (36.5 C) (Temporal)    Ht 5\' 7"  (1.702 m)    Wt 183 lb (83 kg)    SpO2 98%    BMI 28.66 kg/m   Physical Exam Constitutional:      General: He is not in acute distress.    Appearance: He is not toxic-appearing.  Musculoskeletal:     Right elbow: Swelling and effusion (small effusion) present. No lacerations. Normal range of motion. Tenderness present in olecranon process. No radial head, medial epicondyle or lateral epicondyle tenderness.     Comments: Skin over olecranon is mildly erythematous and warm to touch  Neurological:     General: No focal deficit present.     Mental Status: He is alert and oriented to person, place, and time. Mental status is at  baseline.     Sensory: No sensory deficit.     Deep Tendon Reflexes: Reflexes normal.  Psychiatric:        Mood and Affect: Mood normal.        Behavior: Behavior normal.      A/P:  1. Pain and swelling of right elbow - ? Gout flare vs septic joint vs less likely arthritis flare vs other. Afebrile today and overall pain and swelling improved so less likely to be infectious etiology - improved as far as pain and swelling  Rx: - indomethacin (INDOCIN) 50 MG capsule; Take 1 capsule (50 mg total) by mouth 3 (three) times daily with meals.  Dispense: 90 capsule; Refill: 0 - Basic metabolic panel - CBC - Uric acid - DG Elbow Complete Right - will contact pts wife with results when available Discussed plan and reviewed medications with patient, including risks, benefits, and potential side effects. Pt expressed understand. All questions answered.    This visit occurred during the SARS-CoV-2 public health emergency.  Safety protocols were in place, including screening questions prior to the visit, additional usage of staff PPE, and extensive cleaning of exam room while observing appropriate contact time as indicated for disinfecting solutions.

## 2020-02-01 ENCOUNTER — Encounter: Payer: Self-pay | Admitting: Family Medicine

## 2020-02-01 LAB — CBC
Hematocrit: 39.3 % (ref 37.5–51.0)
Hemoglobin: 13.8 g/dL (ref 13.0–17.7)
MCH: 31.8 pg (ref 26.6–33.0)
MCHC: 35.1 g/dL (ref 31.5–35.7)
MCV: 91 fL (ref 79–97)
Platelets: 310 10*3/uL (ref 150–450)
RBC: 4.34 x10E6/uL (ref 4.14–5.80)
RDW: 12.6 % (ref 11.6–15.4)
WBC: 4.9 10*3/uL (ref 3.4–10.8)

## 2020-02-01 LAB — URIC ACID: Uric Acid: 5.8 mg/dL (ref 3.8–8.4)

## 2020-02-01 LAB — BASIC METABOLIC PANEL
BUN/Creatinine Ratio: 11 (ref 9–20)
BUN: 12 mg/dL (ref 6–24)
CO2: 27 mmol/L (ref 20–29)
Calcium: 8.5 mg/dL — ABNORMAL LOW (ref 8.7–10.2)
Chloride: 105 mmol/L (ref 96–106)
Creatinine, Ser: 1.05 mg/dL (ref 0.76–1.27)
GFR calc Af Amer: 93 mL/min/{1.73_m2} (ref >59)
GFR calc non Af Amer: 81 mL/min/{1.73_m2} (ref >59)
Glucose: 102 mg/dL — ABNORMAL HIGH (ref 65–99)
Potassium: 3.6 mmol/L (ref 3.5–5.2)
Sodium: 142 mmol/L (ref 134–144)

## 2020-02-05 ENCOUNTER — Telehealth: Payer: Self-pay | Admitting: Family Medicine

## 2020-02-05 NOTE — Telephone Encounter (Signed)
Patient wife called and stated that Dr. Loletha Grayer called in rosuvastatin that is not covered by insurance and wanted to see if an alternate could be sent to the pharmacy that is covered. Please advise. CB is [phone]

## 2020-02-26 ENCOUNTER — Other Ambulatory Visit: Payer: Self-pay

## 2020-02-27 ENCOUNTER — Encounter: Payer: Self-pay | Admitting: Family Medicine

## 2020-02-27 ENCOUNTER — Telehealth (INDEPENDENT_AMBULATORY_CARE_PROVIDER_SITE_OTHER): Payer: Managed Care, Other (non HMO) | Admitting: Family Medicine

## 2020-02-27 VITALS — Ht 67.0 in | Wt 184.0 lb

## 2020-02-27 DIAGNOSIS — R030 Elevated blood-pressure reading, without diagnosis of hypertension: Secondary | ICD-10-CM | POA: Insufficient documentation

## 2020-02-27 DIAGNOSIS — M19041 Primary osteoarthritis, right hand: Secondary | ICD-10-CM

## 2020-02-27 DIAGNOSIS — M19042 Primary osteoarthritis, left hand: Secondary | ICD-10-CM

## 2020-02-27 DIAGNOSIS — J22 Unspecified acute lower respiratory infection: Secondary | ICD-10-CM

## 2020-02-27 DIAGNOSIS — E7800 Pure hypercholesterolemia, unspecified: Secondary | ICD-10-CM

## 2020-02-27 HISTORY — DX: Unspecified acute lower respiratory infection: J22

## 2020-02-27 HISTORY — DX: Pure hypercholesterolemia, unspecified: E78.00

## 2020-02-27 HISTORY — DX: Primary osteoarthritis, right hand: M19.041

## 2020-02-27 MED ORDER — CLARITHROMYCIN ER 500 MG PO TB24: 1000.0000 mg | ORAL_TABLET | Freq: Every day | ORAL | 0 refills | Status: AC

## 2020-02-27 MED ORDER — BENZONATATE 200 MG PO CAPS
200.0000 mg | ORAL_CAPSULE | Freq: Two times a day (BID) | ORAL | 0 refills | Status: DC | PRN
Start: 2020-02-27 — End: 2020-05-06

## 2020-02-27 MED ORDER — DICLOFENAC SODIUM 1 % EX GEL: CUTANEOUS | 1 refills | Status: DC

## 2020-02-27 NOTE — Progress Notes (Addendum)
Established Patient Office Visit  Subjective:  Patient ID: Harold Morgan, male    DOB: 1965-09-02  Age: 54 y.o. MRN: [id number]  CC:  Chief Complaint  Patient presents with  . Cough    cough, sore throat, chest congestion, runny nose thick yellow mucus x 6 days. Patient states that he was prescribed medications for symptoms but stopped due to it making him sleepy while at work.     HPI Harold Morgan presents for 1 week history of nasal congestion postnasal drip cough productive of purulent phlegm.  Denies fevers chills, wheezing or tightness in the chest or tobacco use.  Denies alteration in taste or smell or diarrhea with this illness.  Finished his Covid vaccine series a few months ago.  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  Was given a 1 year card.  Pressure was elevated in his visit this past June.  Never filled prescription for Crestor for his elevated LDL cholesterol secondary to cost.  Past Medical History:  Diagnosis Date  . Heartburn   . HLD (hyperlipidemia)   . HTN (hypertension)   . Kidney stone     Past Surgical History:  Procedure Laterality Date  . LITHOTRIPSY    . TONSILLECTOMY      Family History  Problem Relation Age of Onset  . Kidney cancer Sister   . Bladder Cancer Neg Hx   . Prostate cancer Neg Hx     Social History   Socioeconomic History  . Marital status: Married    Spouse name: Not on file  . Number of children: Not on file  . Years of education: Not on file  . Highest education level: Not on file  Occupational History  . Not on file  Tobacco Use  . Smoking status: Never Smoker  . Smokeless tobacco: Former Systems developer    Types: Chew  Substance and Sexual Activity  . Alcohol use: Yes  . Drug use: No  . Sexual activity: Not on file  Other Topics Concern  . Not on file  Social History Narrative  . Not on file   Social Determinants of Health   Financial Resource Strain:   . Difficulty of Paying Living Expenses:   Food  Insecurity:   . Worried About Charity fundraiser in the Last Year:   . Arboriculturist in the Last Year:   Transportation Needs:   . Film/video editor (Medical):   Marland Kitchen Lack of Transportation (Non-Medical):   Physical Activity:   . Days of Exercise per Week:   . Minutes of Exercise per Session:   Stress:   . Feeling of Stress :   Social Connections:   . Frequency of Communication with Friends and Family:   . Frequency of Social Gatherings with Friends and Family:   . Attends Religious Services:   . Active Member of Clubs or Organizations:   . Attends Archivist Meetings:   Marland Kitchen Marital Status:   Intimate Partner Violence:   . Fear of Current or Ex-Partner:   . Emotionally Abused:   Marland Kitchen Physically Abused:   . Sexually Abused:     Outpatient Medications Prior to Visit  Medication Sig Dispense Refill  . indomethacin (INDOCIN) 50 MG capsule Take 1 capsule (50 mg total) by mouth 3 (three) times daily with meals. (Patient not taking: Reported on 02/27/2020) 90 capsule 0  . rosuvastatin (CRESTOR) 40 MG tablet Take 1 tablet (40 mg total) by mouth daily. (Patient not  taking: Reported on 02/27/2020) 90 tablet 3   No facility-administered medications prior to visit.    No Known Allergies  ROS Review of Systems  Constitutional: Negative for chills, diaphoresis, fatigue, fever and unexpected weight change.  HENT: Positive for congestion and postnasal drip.   Eyes: Negative for photophobia and visual disturbance.  Respiratory: Positive for cough. Negative for chest tightness, shortness of breath and wheezing.   Cardiovascular: Negative.   Gastrointestinal: Negative for diarrhea and vomiting.  Endocrine: Negative for polyphagia and polyuria.  Genitourinary: Negative.   Musculoskeletal: Positive for arthralgias. Negative for myalgias.  Neurological: Negative for speech difficulty and headaches.  Hematological: Does not bruise/bleed easily.  Psychiatric/Behavioral: Negative.        Objective:    Physical Exam Vitals and nursing note reviewed.  Constitutional:      General: He is not in acute distress.    Appearance: Normal appearance. He is not ill-appearing, toxic-appearing or diaphoretic.  HENT:     Head: Normocephalic and atraumatic.     Right Ear: External ear normal.     Left Ear: External ear normal.  Eyes:     General: No scleral icterus.       Right eye: No discharge.        Left eye: No discharge.     Conjunctiva/sclera: Conjunctivae normal.  Pulmonary:     Effort: Pulmonary effort is normal.  Skin:    General: Skin is warm and dry.  Neurological:     Mental Status: He is alert and oriented to person, place, and time.  Psychiatric:        Mood and Affect: Mood normal.        Behavior: Behavior normal.     Ht 5\' 7"  (1.702 m)   Wt 184 lb (83.5 kg)   BMI 28.82 kg/m  Wt Readings from Last 3 Encounters:  02/27/20 184 lb (83.5 kg)  01/31/20 183 lb (83 kg)  10/24/17 183 lb (83 kg)     Health Maintenance Due  Topic Date Due  . Hepatitis C Screening  Never done  . COVID-19 Vaccine (1) Never done  . TETANUS/TDAP  Never done  . COLONOSCOPY  Never done    There are no preventive care reminders to display for this patient.  No results found for: TSH Lab Results  Component Value Date   WBC 4.9 01/31/2020   HGB 13.8 01/31/2020   HCT 39.3 01/31/2020   MCV 91 01/31/2020   PLT 310 01/31/2020   Lab Results  Component Value Date   NA 142 01/31/2020   K 3.6 01/31/2020   CO2 27 01/31/2020   GLUCOSE 102 (H) 01/31/2020   BUN 12 01/31/2020   CREATININE 1.05 01/31/2020   BILITOT 0.5 10/24/2017   ALKPHOS 47 10/24/2017   AST 19 10/24/2017   ALT 27 10/24/2017   PROT 6.6 10/24/2017   ALBUMIN 4.3 10/24/2017   CALCIUM 8.5 (L) 01/31/2020   Lab Results  Component Value Date   CHOL 247 (H) 10/24/2017   Lab Results  Component Value Date   HDL 43 10/24/2017   Lab Results  Component Value Date   LDLCALC 182 (H) 10/24/2017   Lab  Results  Component Value Date   TRIG 108 10/24/2017   Lab Results  Component Value Date   CHOLHDL 5.7 (H) 10/24/2017   No results found for: HGBA1C    Assessment & Plan:   Problem List Items Addressed This Visit      Respiratory  Lower respiratory infection - Primary   Relevant Medications   clarithromycin (BIAXIN XL) 500 MG 24 hr tablet   benzonatate (TESSALON) 200 MG capsule     Musculoskeletal and Integument   Arthritis of both hands   Relevant Medications   diclofenac Sodium (VOLTAREN) 1 % GEL     Other   Elevated LDL cholesterol level   Relevant Medications   atorvastatin (LIPITOR) 40 MG tablet   Elevated BP without diagnosis of hypertension      Meds ordered this encounter  Medications  . clarithromycin (BIAXIN XL) 500 MG 24 hr tablet    Sig: Take 2 tablets (1,000 mg total) by mouth daily for 10 days.    Dispense:  20 tablet    Refill:  0  . benzonatate (TESSALON) 200 MG capsule    Sig: Take 1 capsule (200 mg total) by mouth 2 (two) times daily as needed for cough.    Dispense:  20 capsule    Refill:  0  . diclofenac Sodium (VOLTAREN) 1 % GEL    Sig: Rub a grape sized portion on sore joint up to 4 times daily.    Dispense:  150 g    Refill:  1  . atorvastatin (LIPITOR) 40 MG tablet    Sig: Take 1 tablet (40 mg total) by mouth daily. Please finish biaxin prior to starting atorvastatin.    Dispense:  90 tablet    Refill:  3    Follow-up: Return Follow up soon for CPE..  Take and finish Biaxin and Tessalon as directed.  Will use Voltaren gel for arthritic aches in his hands and elbows.  Follow-up fasting for complete physical to follow-up on elevated blood pressure and elevated LDL cholesterol.  Libby Maw, MD   Virtual Visit via Video Note  I connected with Harold Morgan on 02/29/20 at  8:00 AM EDT by a video enabled telemedicine application and verified that I am speaking with the correct person using two  identifiers.  Location: Patient: home with wife. Provider:    I discussed the limitations of evaluation and management by telemedicine and the availability of in person appointments. The patient expressed understanding and agreed to proceed.  History of Present Illness:    Observations/Objective:   Assessment and Plan:   Follow Up Instructions:    I discussed the assessment and treatment plan with the patient. The patient was provided an opportunity to ask questions and all were answered. The patient agreed with the plan and demonstrated an understanding of the instructions.   The patient was advised to call back or seek an in-person evaluation if the symptoms worsen or if the condition fails to improve as anticipated.  I provided 30 minutes of non-face-to-face time during this encounter.   Libby Maw, MD

## 2020-02-28 ENCOUNTER — Encounter: Payer: Self-pay | Admitting: Family Medicine

## 2020-02-29 MED ORDER — ATORVASTATIN CALCIUM 40 MG PO TABS: 40.0000 mg | ORAL_TABLET | Freq: Every day | ORAL | 3 refills | Status: DC

## 2020-02-29 NOTE — Addendum Note (Signed)
Addended by: Jon Billings on: 02/29/2020 09:55 AM   Modules accepted: Orders

## 2020-02-29 NOTE — Telephone Encounter (Signed)
Please remind him to come in  for a physical exam preferably 2 mos or so after starting atorvastatin.

## 2020-02-29 NOTE — Telephone Encounter (Signed)
Sent a rx for atorvastatin to his mail order pharmacy to start after he has completed his course of biaxin.

## 2020-04-01 ENCOUNTER — Encounter: Payer: Managed Care, Other (non HMO) | Admitting: Family Medicine

## 2020-04-13 ENCOUNTER — Other Ambulatory Visit: Payer: Self-pay | Admitting: Family Medicine

## 2020-04-13 DIAGNOSIS — M25521 Pain in right elbow: Secondary | ICD-10-CM

## 2020-04-18 ENCOUNTER — Other Ambulatory Visit: Payer: Self-pay

## 2020-04-18 DIAGNOSIS — M25421 Effusion, right elbow: Secondary | ICD-10-CM

## 2020-04-18 MED ORDER — INDOMETHACIN 50 MG PO CAPS: 50.0000 mg | ORAL_CAPSULE | Freq: Three times a day (TID) | ORAL | 0 refills | Status: DC

## 2020-05-05 ENCOUNTER — Telehealth: Payer: Self-pay | Admitting: Family Medicine

## 2020-05-05 NOTE — Telephone Encounter (Signed)
Patient spouse called and stated that patients blood pressure was 160/100 this morning. Transferred wife to the nurse triage for further evaluation, please advise. CB is [phone]

## 2020-05-06 ENCOUNTER — Ambulatory Visit: Payer: Managed Care, Other (non HMO) | Admitting: Nurse Practitioner

## 2020-05-06 ENCOUNTER — Encounter: Payer: Self-pay | Admitting: Nurse Practitioner

## 2020-05-06 VITALS — BP 138/84 | HR 60 | Temp 97.7°F | Ht 67.0 in | Wt 184.2 lb

## 2020-05-06 DIAGNOSIS — I10 Essential (primary) hypertension: Secondary | ICD-10-CM

## 2020-05-06 DIAGNOSIS — Z23 Encounter for immunization: Secondary | ICD-10-CM

## 2020-05-06 HISTORY — DX: Essential (primary) hypertension: I10

## 2020-05-06 LAB — URINALYSIS
Bilirubin Urine: NEGATIVE
Hgb urine dipstick: NEGATIVE
Ketones, ur: NEGATIVE
Leukocytes,Ua: NEGATIVE
Nitrite: NEGATIVE
Specific Gravity, Urine: 1.02 (ref 1.000–1.030)
Total Protein, Urine: NEGATIVE
Urine Glucose: NEGATIVE
Urobilinogen, UA: 0.2 (ref 0.0–1.0)
pH: 7.5 (ref 5.0–8.0)

## 2020-05-06 LAB — BASIC METABOLIC PANEL
BUN: 15 mg/dL (ref 6–23)
CO2: 32 mEq/L (ref 19–32)
Calcium: 8.8 mg/dL (ref 8.4–10.5)
Chloride: 102 mEq/L (ref 96–112)
Creatinine, Ser: 1.03 mg/dL (ref 0.40–1.50)
GFR: 75.2 mL/min (ref >60.00)
Glucose, Bld: 89 mg/dL (ref 70–99)
Potassium: 3.8 mEq/L (ref 3.5–5.1)
Sodium: 140 mEq/L (ref 135–145)

## 2020-05-06 LAB — TSH: TSH: 1.71 u[IU]/mL (ref 0.35–4.50)

## 2020-05-06 MED ORDER — LOSARTAN POTASSIUM 25 MG PO TABS: 25.0000 mg | ORAL_TABLET | Freq: Every day | ORAL | 5 refills | Status: DC

## 2020-05-06 NOTE — Progress Notes (Signed)
Subjective:  Patient ID: Harold Morgan, male    DOB: 1966/08/08  Age: 54 y.o. MRN: [id number]  CC: Follow-up (F/u on HTN, pt has been checking BP at home and it has been ranging 140-180/88-110. )  HPI Accompanied by wife.  Essential hypertension New, Worsening BP since 12/2019 Home BP readings: 140/95, 143/98, 149/88, 146/93, 155/97, 149/98 Associated with intermittent headache. No FHx of HTN or CVA or CAD/MI. No to tobacco or ETOh use or OSA Admits to high sodium and fat diet. He is a Administrator. BP Readings from Last 3 Encounters:  05/06/20 138/84  01/31/20 (!) 140/92  10/24/17 128/64   Check BMP, urinalysis for protein, and TSH. Start losartan 25mg  Advised about DASH diet F/up with pcp in 25month  BP Readings from Last 3 Encounters:  05/06/20 138/84  01/31/20 (!) 140/92  10/24/17 128/64   Wt Readings from Last 3 Encounters:  05/06/20 184 lb 3.2 oz (83.6 kg)  02/27/20 184 lb (83.5 kg)  01/31/20 183 lb (83 kg)   Reviewed past Medical, Social and Family history today.  Outpatient Medications Prior to Visit  Medication Sig Dispense Refill  . atorvastatin (LIPITOR) 40 MG tablet Take 1 tablet (40 mg total) by mouth daily. Please finish biaxin prior to starting atorvastatin. 90 tablet 3  . indomethacin (INDOCIN) 50 MG capsule Take 1 capsule (50 mg total) by mouth 3 (three) times daily with meals. 90 capsule 0  . rosuvastatin (CRESTOR) 40 MG tablet Take 1 tablet (40 mg total) by mouth daily. 90 tablet 3  . benzonatate (TESSALON) 200 MG capsule Take 1 capsule (200 mg total) by mouth 2 (two) times daily as needed for cough. (Patient not taking: Reported on 05/06/2020) 20 capsule 0  . diclofenac Sodium (VOLTAREN) 1 % GEL Rub a grape sized portion on sore joint up to 4 times daily. (Patient not taking: Reported on 05/06/2020) 150 g 1   No facility-administered medications prior to visit.    ROS See HPI  Objective:  BP 138/84 (BP Location: Left Arm, Patient Position:  Sitting, Cuff Size: Normal)   Pulse 60   Temp 97.7 F (36.5 C) (Temporal)   Ht 5\' 7"  (1.702 m)   Wt 184 lb 3.2 oz (83.6 kg)   SpO2 98%   BMI 28.85 kg/m   Physical Exam Vitals reviewed.  Eyes:     Extraocular Movements: Extraocular movements intact.     Conjunctiva/sclera: Conjunctivae normal.  Neck:     Vascular: No carotid bruit.  Cardiovascular:     Rate and Rhythm: Normal rate and regular rhythm.     Pulses: Normal pulses.     Heart sounds: Normal heart sounds.     Comments: No JVD Pulmonary:     Effort: Pulmonary effort is normal.     Breath sounds: Normal breath sounds.  Musculoskeletal:     Cervical back: Normal range of motion and neck supple.     Right lower leg: No edema.     Left lower leg: No edema.  Neurological:     Mental Status: He is alert and oriented to person, place, and time.    Assessment & Plan:  This visit occurred during the SARS-CoV-2 public health emergency.  Safety protocols were in place, including screening questions prior to the visit, additional usage of staff PPE, and extensive cleaning of exam room while observing appropriate contact time as indicated for disinfecting solutions.   Ovide was seen today for follow-up.  Diagnoses and all orders for  this visit:  Essential hypertension -     Basic metabolic panel -     TSH -     Urinalysis -     losartan (COZAAR) 25 MG tablet; Take 1 tablet (25 mg total) by mouth daily.  Influenza vaccine needed -     Flu Vaccine QUAD 6+ mos PF IM (Fluarix Quad PF)    Problem List Items Addressed This Visit      Cardiovascular and Mediastinum   Essential hypertension - Primary    New, Worsening BP since 12/2019 Home BP readings: 140/95, 143/98, 149/88, 146/93, 155/97, 149/98 Associated with intermittent headache. No FHx of HTN or CVA or CAD/MI. No to tobacco or ETOh use or OSA Admits to high sodium and fat diet. 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 Start losartan 25mg  Advised about DASH diet F/up with pcp in 17month      Relevant Medications   losartan (COZAAR) 25 MG tablet   Other Relevant Orders   Basic metabolic panel (Completed)   TSH (Completed)   Urinalysis (Completed)    Other Visit Diagnoses    Influenza vaccine needed       Relevant Orders   Flu Vaccine QUAD 6+ mos PF IM (Fluarix Quad PF) (Completed)      Follow-up: Return in about 4 weeks (around 06/03/2020) for HTN and headache (with Dr. Ethelene Hal).  Wilfred Lacy, NP

## 2020-05-06 NOTE — Patient Instructions (Signed)
Go to lab for urine collection and blood draw  Maintain DASH diet and adequate oral hydration Start losartan F/up with pcp in 31month Continue to monitor BP 2-3x/week in AM and record.   DASH Eating Plan DASH stands for "Dietary Approaches to Stop Hypertension." The DASH eating plan is a healthy eating plan that has been shown to reduce high blood pressure (hypertension). It may also reduce your risk for type 2 diabetes, heart disease, and stroke. The DASH eating plan may also help with weight loss. What are tips for following this plan?  General guidelines  Avoid eating more than 2,300 mg (milligrams) of salt (sodium) a day. If you have hypertension, you may need to reduce your sodium intake to 1,500 mg a day.  Limit alcohol intake to no more than 1 drink a day for nonpregnant women and 2 drinks a day for men. One drink equals 12 oz of beer, 5 oz of wine, or 1 oz of hard liquor.  Work with your health care provider to maintain a healthy body weight or to lose weight. Ask what an ideal weight is for you.  Get at least 30 minutes of exercise that causes your heart to beat faster (aerobic exercise) most days of the week. Activities may include walking, swimming, or biking.  Work with your health care provider or diet and nutrition specialist (dietitian) to adjust your eating plan to your individual calorie needs. Reading food labels   Check food labels for the amount of sodium per serving. Choose foods with less than 5 percent of the Daily Value of sodium. Generally, foods with less than 300 mg of sodium per serving fit into this eating plan.  To find whole grains, look for the word "whole" as the first word in the ingredient list. Shopping  Buy products labeled as "low-sodium" or "no salt added."  Buy fresh foods. Avoid canned foods and premade or frozen meals. Cooking  Avoid adding salt when cooking. Use salt-free seasonings or herbs instead of table salt or sea salt. Check with  your health care provider or pharmacist before using salt substitutes.  Do not fry foods. Cook foods using healthy methods such as baking, boiling, grilling, and broiling instead.  Cook with heart-healthy oils, such as olive, canola, soybean, or sunflower oil. Meal planning  Eat a balanced diet that includes: ? 5 or more servings of fruits and vegetables each day. At each meal, try to fill half of your plate with fruits and vegetables. ? Up to 6-8 servings of whole grains each day. ? Less than 6 oz of lean meat, poultry, or fish each day. A 3-oz serving of meat is about the same size as a deck of cards. One egg equals 1 oz. ? 2 servings of low-fat dairy each day. ? A serving of nuts, seeds, or beans 5 times each week. ? Heart-healthy fats. Healthy fats called Omega-3 fatty acids are found in foods such as flaxseeds and coldwater fish, like sardines, salmon, and mackerel.  Limit how much you eat of the following: ? Canned or prepackaged foods. ? Food that is high in trans fat, such as fried foods. ? Food that is high in saturated fat, such as fatty meat. ? Sweets, desserts, sugary drinks, and other foods with added sugar. ? Full-fat dairy products.  Do not salt foods before eating.  Try to eat at least 2 vegetarian meals each week.  Eat more home-cooked food and less restaurant, buffet, and fast food.  When eating  at a restaurant, ask that your food be prepared with less salt or no salt, if possible. What foods are recommended? The items listed may not be a complete list. Talk with your dietitian about what dietary choices are best for you. Grains Whole-grain or whole-wheat bread. Whole-grain or whole-wheat pasta. Brown rice. [place]. Bulgur. Whole-grain and low-sodium cereals. Pita bread. Low-fat, low-sodium crackers. Whole-wheat flour tortillas. Vegetables Fresh or frozen vegetables (raw, steamed, roasted, or grilled). Low-sodium or reduced-sodium tomato and vegetable  juice. Low-sodium or reduced-sodium tomato sauce and tomato paste. Low-sodium or reduced-sodium canned vegetables. Fruits All fresh, dried, or frozen fruit. Canned fruit in natural juice (without added sugar). Meat and other protein foods Skinless chicken or [place]. Ground chicken or [place]. Pork with fat trimmed off. Fish and seafood. Egg whites. Dried beans, peas, or lentils. Unsalted nuts, nut butters, and seeds. Unsalted canned beans. Lean cuts of beef with fat trimmed off. Low-sodium, lean deli meat. Dairy Low-fat (1%) or fat-free (skim) milk. Fat-free, low-fat, or reduced-fat cheeses. Nonfat, low-sodium ricotta or cottage cheese. Low-fat or nonfat yogurt. Low-fat, low-sodium cheese. Fats and oils Soft margarine without trans fats. Vegetable oil. Low-fat, reduced-fat, or light mayonnaise and salad dressings (reduced-sodium). Canola, safflower, olive, soybean, and sunflower oils. Avocado. Seasoning and other foods Herbs. Spices. Seasoning mixes without salt. Unsalted popcorn and pretzels. Fat-free sweets. What foods are not recommended? The items listed may not be a complete list. Talk with your dietitian about what dietary choices are best for you. Grains Baked goods made with fat, such as croissants, muffins, or some breads. Dry pasta or rice meal packs. Vegetables Creamed or fried vegetables. Vegetables in a cheese sauce. Regular canned vegetables (not low-sodium or reduced-sodium). Regular canned tomato sauce and paste (not low-sodium or reduced-sodium). Regular tomato and vegetable juice (not low-sodium or reduced-sodium). Angie Fava. Olives. Fruits Canned fruit in a light or heavy syrup. Fried fruit. Fruit in cream or butter sauce. Meat and other protein foods Fatty cuts of meat. Ribs. Fried meat. Berniece Salines. Sausage. Bologna and other processed lunch meats. Salami. Fatback. Hotdogs. Bratwurst. Salted nuts and seeds. Canned beans with added salt. Canned or smoked fish. Whole eggs or egg yolks.  Chicken or [place] with skin. Dairy Whole or 2% milk, cream, and half-and-half. Whole or full-fat cream cheese. Whole-fat or sweetened yogurt. Full-fat cheese. Nondairy creamers. Whipped toppings. Processed cheese and cheese spreads. Fats and oils Butter. Stick margarine. Lard. Shortening. Ghee. Bacon fat. Tropical oils, such as coconut, palm kernel, or palm oil. Seasoning and other foods Salted popcorn and pretzels. Onion salt, garlic salt, seasoned salt, table salt, and sea salt. Worcestershire sauce. Tartar sauce. Barbecue sauce. Teriyaki sauce. Soy sauce, including reduced-sodium. Steak sauce. Canned and packaged gravies. Fish sauce. Oyster sauce. Cocktail sauce. Horseradish that you find on the shelf. Ketchup. Mustard. Meat flavorings and tenderizers. Bouillon cubes. Hot sauce and Tabasco sauce. Premade or packaged marinades. Premade or packaged taco seasonings. Relishes. Regular salad dressings. Where to find more information:  National Heart, Lung, and [place]: https://wilson-eaton.com/  American Heart Association: www.heart.org Summary  The DASH eating plan is a healthy eating plan that has been shown to reduce high blood pressure (hypertension). It may also reduce your risk for type 2 diabetes, heart disease, and stroke.  With the DASH eating plan, you should limit salt (sodium) intake to 2,300 mg a day. If you have hypertension, you may need to reduce your sodium intake to 1,500 mg a day.  When on the DASH eating plan, aim to  eat more fresh fruits and vegetables, whole grains, lean proteins, low-fat dairy, and heart-healthy fats.  Work with your health care provider or diet and nutrition specialist (dietitian) to adjust your eating plan to your individual calorie needs. This information is not intended to replace advice given to you by your health care provider. Make sure you discuss any questions you have with your health care provider. Document Revised: 07/08/2017 Document Reviewed:  07/19/2016 Elsevier Patient Education  2020 [employer].

## 2020-05-06 NOTE — Assessment & Plan Note (Addendum)
New, Worsening BP since 12/2019 Home BP readings: 140/95, 143/98, 149/88, 146/93, 155/97, 149/98 Associated with intermittent headache. No FHx of HTN or CVA or CAD/MI. No to tobacco or ETOh use or OSA Admits to high sodium and fat diet. He is a Administrator. BP Readings from Last 3 Encounters:  05/06/20 138/84  01/31/20 (!) 140/92  10/24/17 128/64   Check BMP, urinalysis for protein, and TSH. Start losartan 25mg  Advised about DASH diet F/up with pcp in 59month

## 2020-05-11 ENCOUNTER — Other Ambulatory Visit: Payer: Self-pay | Admitting: Family Medicine

## 2020-05-11 DIAGNOSIS — M25521 Pain in right elbow: Secondary | ICD-10-CM

## 2020-05-11 DIAGNOSIS — M25421 Effusion, right elbow: Secondary | ICD-10-CM

## 2020-05-14 ENCOUNTER — Encounter: Payer: Managed Care, Other (non HMO) | Admitting: Family Medicine

## 2020-05-26 ENCOUNTER — Other Ambulatory Visit: Payer: Self-pay

## 2020-05-27 ENCOUNTER — Encounter: Payer: Self-pay | Admitting: Family Medicine

## 2020-05-27 ENCOUNTER — Ambulatory Visit: Payer: Managed Care, Other (non HMO) | Admitting: Family Medicine

## 2020-05-27 VITALS — BP 136/90 | HR 82 | Temp 98.1°F | Ht 67.0 in | Wt 184.2 lb

## 2020-05-27 DIAGNOSIS — F5101 Primary insomnia: Secondary | ICD-10-CM

## 2020-05-27 DIAGNOSIS — I10 Essential (primary) hypertension: Secondary | ICD-10-CM

## 2020-05-27 DIAGNOSIS — E7800 Pure hypercholesterolemia, unspecified: Secondary | ICD-10-CM

## 2020-05-27 MED ORDER — LOSARTAN POTASSIUM 50 MG PO TABS: 50.0000 mg | ORAL_TABLET | Freq: Every day | ORAL | 3 refills | Status: DC

## 2020-05-27 MED ORDER — ROSUVASTATIN CALCIUM 40 MG PO TABS: 40.0000 mg | ORAL_TABLET | Freq: Every day | ORAL | 3 refills | Status: DC

## 2020-05-27 MED ORDER — AMITRIPTYLINE HCL 25 MG PO TABS: 25.0000 mg | ORAL_TABLET | Freq: Every day | ORAL | 1 refills | Status: DC

## 2020-05-27 NOTE — Progress Notes (Signed)
Established Patient Office Visit  Subjective:  Patient ID: Harold Morgan, male    DOB: 1966-03-14  Age: 54 y.o. MRN: [id number]  CC:  Chief Complaint  Patient presents with  . Follow-up    follow up on BP and headaches, patient states that headaches and HTN have improved. Patient would like something with to help with sleeping at night.     HPI [name] presents for hypertension, elevated cholesterol and insomnia.  His mother recently moved back in with him.  She is suffering from dementia.  He along with his wife are now responsible for her care.  She is living next door to them independently for now.  They check on her frequently.  She is needing help with her medications.  This has increased his stress level and he is not sleeping well at night.  Blood pressure has been running in the upper 130s to 140s over 90s.  He is a Tourist information centre manager.  He rarely drinks alcohol.  Minimizes caffeine.  He does not smoke.  He has a history of what sounds like ocular migraines.  These are rare.  He is nonfasting today.  Past Medical History:  Diagnosis Date  . Heartburn   . HLD (hyperlipidemia)   . HTN (hypertension)   . Kidney stone     Past Surgical History:  Procedure Laterality Date  . LITHOTRIPSY    . TONSILLECTOMY      Family History  Problem Relation Age of Onset  . Cancer Sister        lung caner, tobacco use  . Emphysema Father   . Aneurysm Maternal Uncle 40       brain  . Bladder Cancer Neg Hx   . Prostate cancer Neg Hx     Social History   Socioeconomic History  . Marital status: Married    Spouse name: Not on file  . Number of children: 2  . Years of education: Not on file  . Highest education level: Not on file  Occupational History  . Not on file  Tobacco Use  . Smoking status: Never Smoker  . Smokeless tobacco: Former Systems developer    Types: Chew  Substance and Sexual Activity  . Alcohol use: Yes    Comment: social  . Drug use: No  . Sexual activity: Yes  Other  Topics Concern  . Not on file  Social History Narrative  . Not on file   Social Determinants of Health   Financial Resource Strain:   . Difficulty of Paying Living Expenses: Not on file  Food Insecurity:   . Worried About Charity fundraiser in the Last Year: Not on file  . Ran Out of Food in the Last Year: Not on file  Transportation Needs:   . Lack of Transportation (Medical): Not on file  . Lack of Transportation (Non-Medical): Not on file  Physical Activity:   . Days of Exercise per Week: Not on file  . Minutes of Exercise per Session: Not on file  Stress:   . Feeling of Stress : Not on file  Social Connections:   . Frequency of Communication with Friends and Family: Not on file  . Frequency of Social Gatherings with Friends and Family: Not on file  . Attends Religious Services: Not on file  . Active Member of Clubs or Organizations: Not on file  . Attends Archivist Meetings: Not on file  . Marital Status: Not on file  Intimate Partner  Violence:   . Fear of Current or Ex-Partner: Not on file  . Emotionally Abused: Not on file  . Physically Abused: Not on file  . Sexually Abused: Not on file    Outpatient Medications Prior to Visit  Medication Sig Dispense Refill  . doxycycline (VIBRAMYCIN) 100 MG capsule Take 100 mg by mouth 2 (two) times daily.    . indomethacin (INDOCIN) 50 MG capsule TAKE 1 CAPSULE (50 MG TOTAL) BY MOUTH 3 (THREE) TIMES DAILY WITH MEALS. 90 capsule 0  . atorvastatin (LIPITOR) 40 MG tablet Take 1 tablet (40 mg total) by mouth daily. Please finish biaxin prior to starting atorvastatin. 90 tablet 3  . losartan (COZAAR) 25 MG tablet Take 1 tablet (25 mg total) by mouth daily. 30 tablet 5  . rosuvastatin (CRESTOR) 40 MG tablet Take 1 tablet (40 mg total) by mouth daily. 90 tablet 3   No facility-administered medications prior to visit.    No Known Allergies  ROS Review of Systems  Constitutional: Negative.   HENT: Negative.   Eyes:  Negative for photophobia and visual disturbance.  Respiratory: Negative.   Cardiovascular: Negative.   Gastrointestinal: Negative.   Endocrine: Negative for polyphagia and polyuria.  Genitourinary: Negative.   Musculoskeletal: Negative for gait problem and joint swelling.  Skin: Negative for pallor and rash.  Allergic/Immunologic: Negative for immunocompromised state.  Neurological: Negative for speech difficulty and light-headedness.  Hematological: Does not bruise/bleed easily.  Psychiatric/Behavioral: Negative.       Objective:    Physical Exam Vitals and nursing note reviewed.  Constitutional:      General: He is not in acute distress.    Appearance: Normal appearance. He is normal weight. He is not ill-appearing, toxic-appearing or diaphoretic.  HENT:     Head: Normocephalic and atraumatic.     Right Ear: Tympanic membrane, ear canal and external ear normal.     Left Ear: Tympanic membrane, ear canal and external ear normal.  Eyes:     General: No scleral icterus.       Right eye: No discharge.        Left eye: No discharge.     Extraocular Movements: Extraocular movements intact.     Conjunctiva/sclera: Conjunctivae normal.     Pupils: Pupils are equal, round, and reactive to light.  Cardiovascular:     Rate and Rhythm: Normal rate and regular rhythm.  Pulmonary:     Effort: Pulmonary effort is normal.     Breath sounds: Normal breath sounds.  Musculoskeletal:     Cervical back: No rigidity or tenderness.  Lymphadenopathy:     Cervical: No cervical adenopathy.  Skin:    General: Skin is warm and dry.  Neurological:     General: No focal deficit present.     Mental Status: He is alert and oriented to person, place, and time.  Psychiatric:        Mood and Affect: Mood normal.        Behavior: Behavior normal.     BP 136/90   Pulse 82   Temp 98.1 F (36.7 C) (Tympanic)   Ht 5\' 7"  (1.702 m)   Wt 184 lb 3.2 oz (83.6 kg)   SpO2 98%   BMI 28.85 kg/m  Wt  Readings from Last 3 Encounters:  05/27/20 184 lb 3.2 oz (83.6 kg)  05/06/20 184 lb 3.2 oz (83.6 kg)  02/27/20 184 lb (83.5 kg)     Health Maintenance Due  Topic Date Due  .  Hepatitis C Screening  Never done  . TETANUS/TDAP  Never done  . COLONOSCOPY  Never done    There are no preventive care reminders to display for this patient.  Lab Results  Component Value Date   TSH 1.71 05/06/2020   Lab Results  Component Value Date   WBC 4.9 01/31/2020   HGB 13.8 01/31/2020   HCT 39.3 01/31/2020   MCV 91 01/31/2020   PLT 310 01/31/2020   Lab Results  Component Value Date   NA 140 05/06/2020   K 3.8 05/06/2020   CO2 32 05/06/2020   GLUCOSE 89 05/06/2020   BUN 15 05/06/2020   CREATININE 1.03 05/06/2020   BILITOT 0.5 10/24/2017   ALKPHOS 47 10/24/2017   AST 19 10/24/2017   ALT 27 10/24/2017   PROT 6.6 10/24/2017   ALBUMIN 4.3 10/24/2017   CALCIUM 8.8 05/06/2020   GFR 75.20 05/06/2020   Lab Results  Component Value Date   CHOL 247 (H) 10/24/2017   Lab Results  Component Value Date   HDL 43 10/24/2017   Lab Results  Component Value Date   LDLCALC 182 (H) 10/24/2017   Lab Results  Component Value Date   TRIG 108 10/24/2017   Lab Results  Component Value Date   CHOLHDL 5.7 (H) 10/24/2017   No results found for: HGBA1C    Assessment & Plan:   Problem List Items Addressed This Visit      Cardiovascular and Mediastinum   Essential hypertension - Primary   Relevant Medications   losartan (COZAAR) 50 MG tablet   rosuvastatin (CRESTOR) 40 MG tablet     Other   Elevated LDL cholesterol level   Relevant Medications   rosuvastatin (CRESTOR) 40 MG tablet    Other Visit Diagnoses    Primary insomnia       Relevant Medications   amitriptyline (ELAVIL) 25 MG tablet      Meds ordered this encounter  Medications  . losartan (COZAAR) 50 MG tablet    Sig: Take 1 tablet (50 mg total) by mouth daily.    Dispense:  90 tablet    Refill:  3  . rosuvastatin  (CRESTOR) 40 MG tablet    Sig: Take 1 tablet (40 mg total) by mouth daily.    Dispense:  90 tablet    Refill:  3  . amitriptyline (ELAVIL) 25 MG tablet    Sig: Take 1 tablet (25 mg total) by mouth at bedtime.    Dispense:  90 tablet    Refill:  1    Follow-up: Return in about 3 months (around 08/27/2020).  Given information on managing hypertension and preventing high cholesterol.  Have increased losartan to 50 mg.  Continue rosuvastatin.  Have added Elavil 25mg  at hs. .  May help prevent some of his headaches and will help him sleep. Libby Maw, MD

## 2020-05-27 NOTE — Patient Instructions (Addendum)
Managing Your Hypertension Hypertension is commonly called high blood pressure. This is when the force of your blood pressing against the walls of your arteries is too strong. Arteries are blood vessels that carry blood from your heart throughout your body. Hypertension forces the heart to work harder to pump blood, and may cause the arteries to become narrow or stiff. Having untreated or uncontrolled hypertension can cause heart attack, stroke, kidney disease, and other problems. What are blood pressure readings? A blood pressure reading consists of a higher number over a lower number. Ideally, your blood pressure should be below 120/80. The first ("top") number is called the systolic pressure. It is a measure of the pressure in your arteries as your heart beats. The second ("bottom") number is called the diastolic pressure. It is a measure of the pressure in your arteries as the heart relaxes. What does my blood pressure reading mean? Blood pressure is classified into four stages. Based on your blood pressure reading, your health care provider may use the following stages to determine what type of treatment you need, if any. Systolic pressure and diastolic pressure are measured in a unit called mm Hg. Normal  Systolic pressure: below 120.  Diastolic pressure: below 80. Elevated  Systolic pressure: 120-129.  Diastolic pressure: below 80. Hypertension stage 1  Systolic pressure: 130-139.  Diastolic pressure: 80-89. Hypertension stage 2  Systolic pressure: 140 or above.  Diastolic pressure: 90 or above. What health risks are associated with hypertension? Managing your hypertension is an important responsibility. Uncontrolled hypertension can lead to:  A heart attack.  A stroke.  A weakened blood vessel (aneurysm).  Heart failure.  Kidney damage.  Eye damage.  Metabolic syndrome.  Memory and concentration problems. What changes can I make to manage my  hypertension? Hypertension can be managed by making lifestyle changes and possibly by taking medicines. Your health care provider will help you make a plan to bring your blood pressure within a normal range. Eating and drinking   Eat a diet that is high in fiber and potassium, and low in salt (sodium), added sugar, and fat. An example eating plan is called the DASH (Dietary Approaches to Stop Hypertension) diet. To eat this way: ? Eat plenty of fresh fruits and vegetables. Try to fill half of your plate at each meal with fruits and vegetables. ? Eat whole grains, such as whole wheat pasta, brown rice, or whole grain bread. Fill about one quarter of your plate with whole grains. ? Eat low-fat diary products. ? Avoid fatty cuts of meat, processed or cured meats, and poultry with skin. Fill about one quarter of your plate with lean proteins such as fish, chicken without skin, beans, eggs, and tofu. ? Avoid premade and processed foods. These tend to be higher in sodium, added sugar, and fat.  Reduce your daily sodium intake. Most people with hypertension should eat less than 1,500 mg of sodium a day.  Limit alcohol intake to no more than 1 drink a day for nonpregnant women and 2 drinks a day for men. One drink equals 12 oz of beer, 5 oz of wine, or 1 oz of hard liquor. Lifestyle  Work with your health care provider to maintain a healthy body weight, or to lose weight. Ask what an ideal weight is for you.  Get at least 30 minutes of exercise that causes your heart to beat faster (aerobic exercise) most days of the week. Activities may include walking, swimming, or biking.  Include exercise   to strengthen your muscles (resistance exercise), such as weight lifting, as part of your weekly exercise routine. Try to do these types of exercises for 30 minutes at least 3 days a week.  Do not use any products that contain nicotine or tobacco, such as cigarettes and e-cigarettes. If you need help quitting,  ask your health care provider.  Control any long-term (chronic) conditions you have, such as high cholesterol or diabetes. Monitoring  Monitor your blood pressure at home as told by your health care provider. Your personal target blood pressure may vary depending on your medical conditions, your age, and other factors.  Have your blood pressure checked regularly, as often as told by your health care provider. Working with your health care provider  Review all the medicines you take with your health care provider because there may be side effects or interactions.  Talk with your health care provider about your diet, exercise habits, and other lifestyle factors that may be contributing to hypertension.  Visit your health care provider regularly. Your health care provider can help you create and adjust your plan for managing hypertension. Will I need medicine to control my blood pressure? Your health care provider may prescribe medicine if lifestyle changes are not enough to get your blood pressure under control, and if:  Your systolic blood pressure is 130 or higher.  Your diastolic blood pressure is 80 or higher. Take medicines only as told by your health care provider. Follow the directions carefully. Blood pressure medicines must be taken as prescribed. The medicine does not work as well when you skip doses. Skipping doses also puts you at risk for problems. Contact a health care provider if:  You think you are having a reaction to medicines you have taken.  You have repeated (recurrent) headaches.  You feel dizzy.  You have swelling in your ankles.  You have trouble with your vision. Get help right away if:  You develop a severe headache or confusion.  You have unusual weakness or numbness, or you feel faint.  You have severe pain in your chest or abdomen.  You vomit repeatedly.  You have trouble breathing. Summary  Hypertension is when the force of blood pumping  through your arteries is too strong. If this condition is not controlled, it may put you at risk for serious complications.  Your personal target blood pressure may vary depending on your medical conditions, your age, and other factors. For most people, a normal blood pressure is less than 120/80.  Hypertension is managed by lifestyle changes, medicines, or both. Lifestyle changes include weight loss, eating a healthy, low-sodium diet, exercising more, and limiting alcohol. This information is not intended to replace advice given to you by your health care provider. Make sure you discuss any questions you have with your health care provider. Document Revised: 11/17/2018 Document Reviewed: 06/23/2016 Elsevier Patient Education  [place].  Preventing High Cholesterol Cholesterol is a white, waxy substance similar to fat that the human body needs to help build cells. The liver makes all the cholesterol that a person's body needs. Having high cholesterol (hypercholesterolemia) increases a person's risk for heart disease and stroke. Extra (excess) cholesterol comes from the food the person eats. High cholesterol can often be prevented with diet and lifestyle changes. If you already have high cholesterol, you can control it with diet and lifestyle changes and with medicine. How can high cholesterol affect me? If you have high cholesterol, deposits (plaques) may build up on the  walls of your arteries. The arteries are the blood vessels that carry blood away from your heart. Plaques make the arteries narrower and stiffer. This can limit or block blood flow and cause blood clots to form. Blood clots:  Are tiny balls of cells that form in your blood.  Can move to the heart or brain, causing a heart attack or stroke. Plaques in arteries greatly increase your risk for heart attack and stroke.Making diet and lifestyle changes can reduce your risk for these conditions that may threaten your  life. What can increase my risk? This condition is more likely to develop in people who:  Eat foods that are high in saturated fat or cholesterol. Saturated fat is mostly found in: ? Foods that contain animal fat, such as red meat and some dairy products. ? Certain fatty foods made from plants, such as tropical oils.  Are overweight.  Are not getting enough exercise.  Have a family history of high cholesterol. What actions can I take to prevent this? Nutrition   Eat less saturated fat.  Avoid trans fats (partially hydrogenated oils). These are often found in margarine and in some baked goods, fried foods, and snacks bought in packages.  Avoid precooked or cured meat, such as sausages or meat loaves.  Avoid foods and drinks that have added sugars.  Eat more fruits, vegetables, and whole grains.  Choose healthy sources of protein, such as fish, poultry, lean cuts of red meat, beans, peas, lentils, and nuts.  Choose healthy sources of fat, such as: ? Nuts. ? Vegetable oils, especially olive oil. ? Fish that have healthy fats (omega-3 fatty acids), such as mackerel or salmon. The items listed above may not be a complete list of recommended foods and beverages. Contact a dietitian for more information. Lifestyle  Lose weight if you are overweight. Losing 5-10 lb (2.3-4.5 kg) can help prevent or control high cholesterol. It can also lower your risk for diabetes and high blood pressure. Ask your health care provider to help you with a diet and exercise plan to lose weight safely.  Do not use any products that contain nicotine or tobacco, such as cigarettes, e-cigarettes, and chewing tobacco. If you need help quitting, ask your health care provider.  Limit your alcohol intake. ? Do not drink alcohol if:  Your health care provider tells you not to drink.  You are pregnant, may be pregnant, or are planning to become pregnant. ? If you drink alcohol:  Limit how much you use  to:  0-1 drink a day for women.  0-2 drinks a day for men.  Be aware of how much alcohol is in your drink. In the [place] one drink equals one 12 oz bottle of beer (355 mL), one 5 oz glass of wine (148 mL), or one 1 oz glass of hard liquor (44 mL). Activity   Get enough exercise. Each week, do at least 150 minutes of exercise that takes a medium level of effort (moderate-intensity exercise). ? This is exercise that:  Makes your heart beat faster and makes you breathe harder than usual.  Allows you to still be able to talk. ? You could exercise in short sessions several times a day or longer sessions a few times a week. For example, on 5 days each week, you could walk fast or ride your bike 3 times a day for 10 minutes each time.  Do exercises as told by your health care provider. Medicines  In addition to diet and  lifestyle changes, your health care provider may recommend medicines to help lower cholesterol. This may be a medicine to lower the amount of cholesterol your liver makes. You may need medicine if: ? Diet and lifestyle changes do not lower your cholesterol enough. ? You have high cholesterol and other risk factors for heart disease or stroke.  Take over-the-counter and prescription medicines only as told by your health care provider. General information  Manage your risk factors for high cholesterol. Talk with your health care provider about all your risk factors and how to lower your risk.  Manage other conditions that you have, such as diabetes or high blood pressure (hypertension).  Have blood tests to check your cholesterol levels at regular points in time as told by your health care provider.  Keep all follow-up visits as told by your health care provider. This is important. Where to find more information  American Heart Association: www.heart.org  National Heart, Lung, and Blood Institute: https://wilson-eaton.com/ Summary  High cholesterol increases your risk for  heart disease and stroke. By keeping your cholesterol level low, you can reduce your risk for these conditions.  High cholesterol can often be prevented with diet and lifestyle changes.  Work with your health care provider to manage your risk factors, and have your blood tested regularly. This information is not intended to replace advice given to you by your health care provider. Make sure you discuss any questions you have with your health care provider. Document Revised: 11/17/2018 Document Reviewed: 04/03/2016 Elsevier Patient Education  2020 [employer].

## 2020-06-12 ENCOUNTER — Ambulatory Visit: Payer: Managed Care, Other (non HMO) | Admitting: Family Medicine

## 2020-06-14 ENCOUNTER — Other Ambulatory Visit: Payer: Self-pay | Admitting: Family Medicine

## 2020-06-14 DIAGNOSIS — M25521 Pain in right elbow: Secondary | ICD-10-CM

## 2020-06-14 DIAGNOSIS — M25421 Effusion, right elbow: Secondary | ICD-10-CM

## 2020-06-16 NOTE — Telephone Encounter (Signed)
Last OV 05/27/20 Last fill 05/12/20  #90/0

## 2020-07-07 ENCOUNTER — Encounter: Payer: Self-pay | Admitting: Primary Care

## 2020-07-07 ENCOUNTER — Other Ambulatory Visit: Payer: Self-pay

## 2020-07-07 ENCOUNTER — Ambulatory Visit: Payer: Managed Care, Other (non HMO) | Admitting: Primary Care

## 2020-07-07 ENCOUNTER — Ambulatory Visit (INDEPENDENT_AMBULATORY_CARE_PROVIDER_SITE_OTHER)
Admission: RE | Admit: 2020-07-07 | Discharge: 2020-07-07 | Disposition: A | Discharge status: Home / Self Care | Payer: Managed Care, Other (non HMO) | Source: Ambulatory Visit | Attending: Primary Care | Admitting: Primary Care

## 2020-07-07 VITALS — BP 136/88 | HR 52 | Temp 97.1°F | Ht 67.0 in | Wt 190.0 lb

## 2020-07-07 DIAGNOSIS — Z87442 Personal history of urinary calculi: Secondary | ICD-10-CM | POA: Diagnosis not present

## 2020-07-07 DIAGNOSIS — N50811 Right testicular pain: Secondary | ICD-10-CM

## 2020-07-07 DIAGNOSIS — R350 Frequency of micturition: Secondary | ICD-10-CM

## 2020-07-07 DIAGNOSIS — Z23 Encounter for immunization: Secondary | ICD-10-CM

## 2020-07-07 DIAGNOSIS — R300 Dysuria: Secondary | ICD-10-CM

## 2020-07-07 HISTORY — DX: Right testicular pain: N50.811

## 2020-07-07 HISTORY — DX: Frequency of micturition: R35.0

## 2020-07-07 LAB — POC URINALSYSI DIPSTICK (AUTOMATED)
Bilirubin, UA: NEGATIVE
Blood, UA: NEGATIVE
Glucose, UA: NEGATIVE
Ketones, UA: NEGATIVE
Leukocytes, UA: NEGATIVE
Nitrite, UA: NEGATIVE
Protein, UA: NEGATIVE
Spec Grav, UA: 1.015 (ref 1.010–1.025)
Urobilinogen, UA: 0.2 E.U./dL
pH, UA: 8 (ref 5.0–8.0)

## 2020-07-07 MED ORDER — TAMSULOSIN HCL 0.4 MG PO CAPS: 0.4000 mg | ORAL_CAPSULE | Freq: Every day | ORAL | 0 refills | Status: DC

## 2020-07-07 NOTE — Progress Notes (Signed)
po

## 2020-07-07 NOTE — Progress Notes (Signed)
Subjective:    Patient ID: Harold Morgan, male    DOB: 07-27-1966, 54 y.o.   MRN: [id number]  HPI  This visit occurred during the SARS-CoV-2 public health emergency.  Safety protocols were in place, including screening questions prior to the visit, additional usage of staff PPE, and extensive cleaning of exam room while observing appropriate contact time as indicated for disinfecting solutions.   Harold Morgan is a 54 year old male patient of Dr. Ethelene Hal with a history of hypertension, renal stones who presents today with a chief complaint of urinary pressure.  He also reports urinary frequency and urgency, a sensation of incomplete bladder emptying, and a pressure to the mid shaft of his penis when urinating. These symptoms began about one week ago but have progressed over the last several days and are waxing and waning. History of recurrent renal stones with last renal stone episode occuring about 2 years ago. Typically he experiences flank/lower back pain with hematuria with renal stones, he has not experienced this over the last week.   He's also reporting chronic right testicular pain for which he describes as a "soreness" that began about six months ago. Over the last two months his pain is occurring more frequently and has intensified. He's noticed testicle  discomfort with tighter pants, movements, and now if his large dog brushes up against him. 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   He denies hematuria, penile discharge, testicular masses, fevers. He has a long history of nocturia, denies increased frequency. He completed a telemedicine visit over the weekend but was told that he needed an in person evaluation. He is drinking soda mostly during the day, does not drink water.   BP Readings from Last 3 Encounters:  07/07/20 136/88  05/27/20 136/90  05/06/20 138/84     Review of Systems  Constitutional:  Negative for fever.  Gastrointestinal: Negative for abdominal pain.  Genitourinary: Positive for difficulty urinating, frequency, testicular pain and urgency. Negative for discharge, dysuria, flank pain, hematuria and penile pain.       Past Medical History:  Diagnosis Date   Heartburn    HLD (hyperlipidemia)    HTN (hypertension)    Kidney stone      Social History   Socioeconomic History   Marital status: Married    Spouse name: Not on file   Number of children: 2   Years of education: Not on file   Highest education level: Not on file  Occupational History   Not on file  Tobacco Use   Smoking status: Never Smoker   Smokeless tobacco: Former Systems developer    Types: Chew  Substance and Sexual Activity   Alcohol use: Yes    Comment: social   Drug use: No   Sexual activity: Yes  Other Topics Concern   Not on file  Social History Narrative   Not on file   Social Determinants of Health   Financial Resource Strain:    Difficulty of Paying Living Expenses: Not on file  Food Insecurity:    Worried About Charity fundraiser in the Last Year: Not on file   [employer] of Food in the Last Year: Not on file  Transportation Needs:    Lack of Transportation (Medical): Not on file   Lack of Transportation (Non-Medical): Not on file  Physical Activity:    Days of Exercise per Week: Not  on file   Minutes of Exercise per Session: Not on file  Stress:    Feeling of Stress : Not on file  Social Connections:    Frequency of Communication with Friends and Family: Not on file   Frequency of Social Gatherings with Friends and Family: Not on file   Attends Religious Services: Not on file   Active Member of Clubs or Organizations: Not on file   Attends Archivist Meetings: Not on file   Marital Status: Not on file  Intimate Partner Violence:    Fear of Current or Ex-Partner: Not on file   Emotionally Abused: Not on file   Physically Abused: Not on  file   Sexually Abused: Not on file    Past Surgical History:  Procedure Laterality Date   LITHOTRIPSY     TONSILLECTOMY      Family History  Problem Relation Age of Onset   Cancer Sister        lung caner, tobacco use   Emphysema Father    Aneurysm Maternal Uncle 63       brain   Bladder Cancer Neg Hx    Prostate cancer Neg Hx     No Known Allergies  Current Outpatient Medications on File Prior to Visit  Medication Sig Dispense Refill   amitriptyline (ELAVIL) 25 MG tablet Take 1 tablet (25 mg total) by mouth at bedtime. 90 tablet 1   doxycycline (VIBRAMYCIN) 100 MG capsule Take 100 mg by mouth 2 (two) times daily.     indomethacin (INDOCIN) 50 MG capsule TAKE 1 CAPSULE (50 MG TOTAL) BY MOUTH 3 (THREE) TIMES DAILY WITH MEALS. 90 capsule 0   losartan (COZAAR) 50 MG tablet Take 1 tablet (50 mg total) by mouth daily. 90 tablet 3   rosuvastatin (CRESTOR) 40 MG tablet Take 1 tablet (40 mg total) by mouth daily. 90 tablet 3   No current facility-administered medications on file prior to visit.    BP 136/88    Pulse (!) 52    Temp (!) 97.1 F (36.2 C) (Temporal)    Ht 5\' 7"  (1.702 m)    Wt 190 lb (86.2 kg)    SpO2 98%    BMI 29.76 kg/m    Objective:   Physical Exam Exam conducted with a chaperone present.  Constitutional:      General: He is not in acute distress.    Appearance: He is not ill-appearing.  Abdominal:     Tenderness: There is no abdominal tenderness. There is no right CVA tenderness or left CVA tenderness.  Genitourinary:    Testes:        Right: Mass, tenderness, swelling or testicular hydrocele not present.        Left: Mass, tenderness, swelling or testicular hydrocele not present.  Skin:    General: Skin is warm and dry.  Neurological:     Mental Status: He is alert.            Assessment & Plan:

## 2020-07-07 NOTE — Patient Instructions (Addendum)
Stop by the lab and xray prior to leaving today. I will notify you of your results once received.   You will be contacted regarding your testicular ultrasound.  Please let us know if you have not been contacted within a few days.  Start tamsulosin (Flomax) for probable kidney stone vs enlarged prostate.   Please follow up with Dr. Ethelene Hal regarding these symptoms.  It was a pleasure meeting you!

## 2020-07-07 NOTE — Assessment & Plan Note (Signed)
Chronic for the last 6 months, progressing over the last 2 months.  Exam today without acute findings or obvious cause for symptoms.  Ultrasound of scrotum ordered and pending. He will follow up with PCP regarding ongoing pain and symptoms.

## 2020-07-07 NOTE — Assessment & Plan Note (Signed)
Acute for the last one week, also with urinary pressure with urination, prior history of renal stones.  Differentials include renal stone, prostate enlargement, cystitis.    No distress today, also is not displaying his typical renal stone symptoms.  UA today negative.  No blood, leukocytes. We will check CBC and PSA today. KUB films ordered and pending today.  Trial of tamsulosin sent to pharmacy for treatment of either renal stone versus enlarged prostate.  We discussed that his symptoms could improve with tamsulosin for either condition.  He will update [place] if he feels that he is passing a stone with treatment.

## 2020-07-08 ENCOUNTER — Other Ambulatory Visit: Payer: Self-pay | Admitting: Primary Care

## 2020-07-08 DIAGNOSIS — R350 Frequency of micturition: Secondary | ICD-10-CM

## 2020-07-08 LAB — BASIC METABOLIC PANEL
BUN/Creatinine Ratio: 17 (ref 9–20)
BUN: 19 mg/dL (ref 6–24)
CO2: 25 mmol/L (ref 20–29)
Calcium: 8.8 mg/dL (ref 8.7–10.2)
Chloride: 103 mmol/L (ref 96–106)
Creatinine, Ser: 1.13 mg/dL (ref 0.76–1.27)
GFR calc Af Amer: 85 mL/min/{1.73_m2} (ref >59)
GFR calc non Af Amer: 73 mL/min/{1.73_m2} (ref >59)
Glucose: 94 mg/dL (ref 65–99)
Potassium: 4.2 mmol/L (ref 3.5–5.2)
Sodium: 141 mmol/L (ref 134–144)

## 2020-07-08 LAB — CBC WITH DIFFERENTIAL/PLATELET
Basophils Absolute: 0 10*3/uL (ref 0.0–0.2)
Basos: 1 %
EOS (ABSOLUTE): 0.4 10*3/uL (ref 0.0–0.4)
Eos: 6 %
Hematocrit: 39.6 % (ref 37.5–51.0)
Hemoglobin: 13.9 g/dL (ref 13.0–17.7)
Immature Grans (Abs): 0 10*3/uL (ref 0.0–0.1)
Immature Granulocytes: 0 %
Lymphocytes Absolute: 1.9 10*3/uL (ref 0.7–3.1)
Lymphs: 28 %
MCH: 31.9 pg (ref 26.6–33.0)
MCHC: 35.1 g/dL (ref 31.5–35.7)
MCV: 91 fL (ref 79–97)
Monocytes Absolute: 0.7 10*3/uL (ref 0.1–0.9)
Monocytes: 10 %
Neutrophils Absolute: 3.8 10*3/uL (ref 1.4–7.0)
Neutrophils: 55 %
Platelets: 289 10*3/uL (ref 150–450)
RBC: 4.36 x10E6/uL (ref 4.14–5.80)
RDW: 12.2 % (ref 11.6–15.4)
WBC: 6.9 10*3/uL (ref 3.4–10.8)

## 2020-07-08 LAB — PSA: Prostate Specific Ag, Serum: 0.4 ng/mL (ref 0.0–4.0)

## 2020-07-09 ENCOUNTER — Telehealth: Payer: Self-pay

## 2020-07-09 NOTE — Telephone Encounter (Signed)
Left message for patient to call back in regards to [place] appointment. Appointment information: 07/11/20 at 3:30 pm at [facility], arrive at 3:15 pm.

## 2020-07-10 NOTE — Telephone Encounter (Signed)
Called patient again and sent mychart information with appointment details.

## 2020-07-11 ENCOUNTER — Other Ambulatory Visit: Payer: Self-pay

## 2020-07-11 ENCOUNTER — Ambulatory Visit
Admission: RE | Admit: 2020-07-11 | Discharge: 2020-07-11 | Disposition: A | Discharge status: Home / Self Care | Payer: Managed Care, Other (non HMO) | Source: Ambulatory Visit | Attending: Primary Care | Admitting: Primary Care

## 2020-07-11 DIAGNOSIS — N50811 Right testicular pain: Secondary | ICD-10-CM | POA: Diagnosis not present

## 2020-07-11 DIAGNOSIS — R350 Frequency of micturition: Secondary | ICD-10-CM

## 2020-07-11 DIAGNOSIS — Z87442 Personal history of urinary calculi: Secondary | ICD-10-CM

## 2020-07-30 ENCOUNTER — Ambulatory Visit: Payer: 59 | Admitting: Urology

## 2020-08-04 ENCOUNTER — Other Ambulatory Visit: Payer: Self-pay

## 2020-08-04 ENCOUNTER — Ambulatory Visit: Payer: Managed Care, Other (non HMO) | Admitting: Urology

## 2020-08-04 ENCOUNTER — Encounter: Payer: Self-pay | Admitting: Urology

## 2020-08-04 VITALS — BP 154/98 | HR 91 | Ht 67.0 in | Wt 180.0 lb

## 2020-08-04 DIAGNOSIS — R102 Pelvic and perineal pain: Secondary | ICD-10-CM | POA: Diagnosis not present

## 2020-08-04 DIAGNOSIS — R82998 Other abnormal findings in urine: Secondary | ICD-10-CM | POA: Diagnosis not present

## 2020-08-04 DIAGNOSIS — N5082 Scrotal pain: Secondary | ICD-10-CM

## 2020-08-04 DIAGNOSIS — Z87442 Personal history of urinary calculi: Secondary | ICD-10-CM

## 2020-08-04 DIAGNOSIS — G8929 Other chronic pain: Secondary | ICD-10-CM

## 2020-08-04 NOTE — Progress Notes (Signed)
08/04/2020 11:33 AM   Harold Morgan 1965/11/03 [id number]  Referring provider: Pleas Koch, NP [name],  [place] [address]  Chief Complaint  Patient presents with  . Testicle Pain    HPI: Harold Morgan is a 54 y.o. male seen at the request of Alma Friendly, NP for evaluation of chronic right scrotal pain.   6+ month history right hemiscrotal pain  Described as intermittent  Can be worse with increased activity or movement and has progressively worsened over the last 3 months  Saw PCP 07/07/2020 and was given a trial of tamsulosin as he was also having bladder pressure sensation and a prior history of stone disease  Took tamsulosin for 30 days with improvement in the pelvic pressure sensation but persistent scrotal pain  No bothersome LUTS  Is on amitriptyline 25 mg daily prescribed October 2021 for insomnia; no improvement in his scrotal pain  Present pain is not similar to previous stone episodes  No chronic back pain or radicular symptoms  Scrotal ultrasound without significant abnormalities  PMH: Past Medical History:  Diagnosis Date  . Heartburn   . HLD (hyperlipidemia)   . HTN (hypertension)   . Kidney stone     Surgical History: Past Surgical History:  Procedure Laterality Date  . LITHOTRIPSY    . TONSILLECTOMY      Home Medications:  Allergies as of 08/04/2020   No Known Allergies     Medication List       Accurate as of August 04, 2020 11:33 AM. If you have any questions, ask your nurse or doctor.        amitriptyline 25 MG tablet Commonly known as: ELAVIL Take 1 tablet (25 mg total) by mouth at bedtime.   doxycycline 100 MG capsule Commonly known as: VIBRAMYCIN Take 100 mg by mouth 2 (two) times daily.   indomethacin 50 MG capsule Commonly known as: INDOCIN TAKE 1 CAPSULE (50 MG TOTAL) BY MOUTH 3 (THREE) TIMES DAILY WITH MEALS.   losartan 50 MG tablet Commonly known as: COZAAR Take 1 tablet (50 mg  total) by mouth daily.   rosuvastatin 40 MG tablet Commonly known as: CRESTOR Take 1 tablet (40 mg total) by mouth daily.   tamsulosin 0.4 MG Caps capsule Commonly known as: FLOMAX Take 1 capsule (0.4 mg total) by mouth daily.       Allergies: No Known Allergies  Family History: Family History  Problem Relation Age of Onset  . Cancer Sister        lung caner, tobacco use  . Emphysema Father   . Aneurysm Maternal Uncle 40       brain  . Bladder Cancer Neg Hx   . Prostate cancer Neg Hx     Social History:  reports that he has never smoked. He has quit using smokeless tobacco.  His smokeless tobacco use included chew. He reports current alcohol use. He reports that he does not use drugs.   Physical Exam: BP (!) 154/98   Pulse 91   Ht 5\' 7"  (1.702 m)   Wt 180 lb (81.6 kg)   BMI 28.19 kg/m   Constitutional:  Alert and oriented, No acute distress. HEENT: [place] AT, moist mucus membranes.  Trachea midline, no masses. Cardiovascular: No clubbing, cyanosis, or edema. Respiratory: Normal respiratory effort, no increased work of breathing. GI: Abdomen is soft, nontender, nondistended, no abdominal masses GU: Phallus without lesions, testes descended bilaterally without masses or tenderness; spermatic cord/epididymis palpably normal  bilaterally; no evidence of inguinal hernia Skin: No rashes, bruises or suspicious lesions. Neurologic: Grossly intact, no focal deficits, moving all 4 extremities. Psychiatric: Normal mood and affect.  Laboratory Data:  Urinalysis Dipstick trace protein Microscopy 0-5 WBC/0 RBC/+ calcium oxalate crystals  Pertinent Imaging: Scrotal ultrasound images personally reviewed and interpreted   Assessment & Plan:    1.  Chronic right scrotal pain  No significant improvement on tamsulosin, amitriptyline  UA today with calcium oxalate crystals  Noncon CT abdomen pelvis to evaluate for sources of referred pain  2.  Crystalluria  History as above  and CT ordered to assess for possible stone as an atypical presentation of his pain  3.  Chronic pelvic pain  4.  History nephrolithiasis   Abbie Sons, MD  [facility] [address], [place], [place] [address] [phone]

## 2020-08-05 ENCOUNTER — Telehealth: Payer: Self-pay | Admitting: *Deleted

## 2020-08-05 LAB — MICROSCOPIC EXAMINATION
Bacteria, UA: NONE SEEN
Epithelial Cells (non renal): NONE SEEN /hpf (ref 0–10)
RBC, Urine: NONE SEEN /hpf (ref 0–2)

## 2020-08-05 LAB — URINALYSIS, COMPLETE
Bilirubin, UA: NEGATIVE
Glucose, UA: NEGATIVE
Ketones, UA: NEGATIVE
Leukocytes,UA: NEGATIVE
Nitrite, UA: NEGATIVE
RBC, UA: NEGATIVE
Specific Gravity, UA: >1.03 — ABNORMAL HIGH (ref 1.005–1.030)
Urobilinogen, Ur: 0.2 mg/dL (ref 0.2–1.0)
pH, UA: 5.5 (ref 5.0–7.5)

## 2020-08-05 NOTE — Telephone Encounter (Signed)
-----   Message from Riki Altes, MD sent at 08/04/2020 10:06 PM EST ----- Regarding: Medication Please let patient know that the medication I was going to send in to his pharmacy was amitriptyline which he is already taking.  The CT has been ordered and will await on these results

## 2020-08-05 NOTE — Telephone Encounter (Signed)
Notified patient as instructed, patient pleased. Discussed follow-up appointments, patient agrees  

## 2020-08-12 ENCOUNTER — Telehealth: Payer: Self-pay | Admitting: Urology

## 2020-08-12 NOTE — Telephone Encounter (Signed)
Rosalita Chessman from [place] [phone]) called regarding the CT authorization for this patient.  He stated that the patient would like to have his CT scan performed at [facility] in [place].  They are requesting a faxed copy of the order to be faxed to [phone].  Case # [id number] Authorization # [id number] Valid from 08/06/20 to 11/04/20

## 2020-09-02 ENCOUNTER — Ambulatory Visit: Payer: Managed Care, Other (non HMO) | Admitting: Family Medicine

## 2020-09-12 ENCOUNTER — Ambulatory Visit: Payer: Managed Care, Other (non HMO) | Admitting: Family Medicine

## 2020-09-19 ENCOUNTER — Encounter: Payer: Self-pay | Admitting: Urology

## 2020-10-13 ENCOUNTER — Ambulatory Visit: Payer: Managed Care, Other (non HMO) | Admitting: Family Medicine

## 2020-10-13 ENCOUNTER — Telehealth: Payer: Self-pay | Admitting: Family Medicine

## 2020-10-13 DIAGNOSIS — M25421 Effusion, right elbow: Secondary | ICD-10-CM

## 2020-10-13 DIAGNOSIS — M25521 Pain in right elbow: Secondary | ICD-10-CM

## 2020-10-13 MED ORDER — INDOMETHACIN 50 MG PO CAPS: 50.0000 mg | ORAL_CAPSULE | Freq: Three times a day (TID) | ORAL | 0 refills | Status: DC

## 2020-10-13 NOTE — Telephone Encounter (Signed)
Patient needs a refill on his Indomethacin 50mg . If approved, please send to [facility] and call patient at [phone] to let him know that it has been sent in.

## 2020-11-10 ENCOUNTER — Ambulatory Visit: Payer: Managed Care, Other (non HMO) | Admitting: Family Medicine

## 2020-11-10 DIAGNOSIS — Z0289 Encounter for other administrative examinations: Secondary | ICD-10-CM

## 2020-12-11 ENCOUNTER — Other Ambulatory Visit: Payer: Self-pay

## 2020-12-11 ENCOUNTER — Encounter: Payer: Self-pay | Admitting: Family Medicine

## 2020-12-11 ENCOUNTER — Ambulatory Visit: Payer: Managed Care, Other (non HMO) | Admitting: Family Medicine

## 2020-12-11 VITALS — Temp 97.2°F | Ht 67.0 in | Wt 184.6 lb

## 2020-12-11 DIAGNOSIS — I10 Essential (primary) hypertension: Secondary | ICD-10-CM | POA: Diagnosis not present

## 2020-12-11 DIAGNOSIS — E7800 Pure hypercholesterolemia, unspecified: Secondary | ICD-10-CM

## 2020-12-11 DIAGNOSIS — R3911 Hesitancy of micturition: Secondary | ICD-10-CM

## 2020-12-11 DIAGNOSIS — F321 Major depressive disorder, single episode, moderate: Secondary | ICD-10-CM | POA: Insufficient documentation

## 2020-12-11 DIAGNOSIS — Z0000 Encounter for general adult medical examination without abnormal findings: Secondary | ICD-10-CM

## 2020-12-11 DIAGNOSIS — N401 Enlarged prostate with lower urinary tract symptoms: Secondary | ICD-10-CM

## 2020-12-11 HISTORY — DX: Benign prostatic hyperplasia with lower urinary tract symptoms: N40.1

## 2020-12-11 MED ORDER — PAROXETINE HCL 10 MG PO TABS: ORAL_TABLET | ORAL | 1 refills | Status: DC

## 2020-12-11 MED ORDER — TAMSULOSIN HCL 0.4 MG PO CAPS: 0.4000 mg | ORAL_CAPSULE | Freq: Every day | ORAL | 4 refills | Status: DC

## 2020-12-11 MED ORDER — ROSUVASTATIN CALCIUM 40 MG PO TABS: 40.0000 mg | ORAL_TABLET | Freq: Every day | ORAL | 3 refills | Status: DC

## 2020-12-11 MED ORDER — LOSARTAN POTASSIUM 50 MG PO TABS: 50.0000 mg | ORAL_TABLET | Freq: Every day | ORAL | 3 refills | Status: DC

## 2020-12-11 NOTE — Addendum Note (Signed)
Addended by: Lynnea Ferrier on: 12/11/2020 08:59 AM   Modules accepted: Orders

## 2020-12-11 NOTE — Progress Notes (Addendum)
Established Patient Office Visit  Subjective:  Patient ID: Harold Morgan, male    DOB: 01/01/1966  Age: 55 y.o. MRN: [id number]  CC:  Chief Complaint  Patient presents with  . Follow-up    Follow up on BP and medications. Per patient he feels as kidney stones are acting up.     HPI Harold Morgan presents for follow-up of his blood pressure, cholesterol.  Ongoing issues with nocturia up to 4, decrease in force of stream and pressure in his suprapubic area.  He has been depressed over the last 4 years since his sister's death.  They were very close.  He has had other deaths in his family since that time.  Admits to sadness loss of interest and anxiety.  He is interested in taking medicine for this.  He has taken Zoloft in the past but felt numb to everything around him.  Past Medical History:  Diagnosis Date  . Heartburn   . HLD (hyperlipidemia)   . HTN (hypertension)   . Kidney stone     Past Surgical History:  Procedure Laterality Date  . LITHOTRIPSY    . TONSILLECTOMY      Family History  Problem Relation Age of Onset  . Cancer Sister        lung caner, tobacco use  . Emphysema Father   . Aneurysm Maternal Uncle 40       brain  . Bladder Cancer Neg Hx   . Prostate cancer Neg Hx     Social History   Socioeconomic History  . Marital status: Married    Spouse name: Not on file  . Number of children: 2  . Years of education: Not on file  . Highest education level: Not on file  Occupational History  . Not on file  Tobacco Use  . Smoking status: Never Smoker  . Smokeless tobacco: Former Systems developer    Types: Chew  Substance and Sexual Activity  . Alcohol use: Yes    Comment: social  . Drug use: No  . Sexual activity: Yes  Other Topics Concern  . Not on file  Social History Narrative  . Not on file   Social Determinants of Health   Financial Resource Strain: Not on file  Food Insecurity: Not on file  Transportation Needs: Not on file  Physical Activity:  Not on file  Stress: Not on file  Social Connections: Not on file  Intimate Partner Violence: Not on file    Outpatient Medications Prior to Visit  Medication Sig Dispense Refill  . indomethacin (INDOCIN) 50 MG capsule Take 1 capsule (50 mg total) by mouth 3 (three) times daily with meals. 90 capsule 0  . amitriptyline (ELAVIL) 25 MG tablet Take 1 tablet (25 mg total) by mouth at bedtime. 90 tablet 1  . losartan (COZAAR) 50 MG tablet Take 1 tablet (50 mg total) by mouth daily. 90 tablet 3  . rosuvastatin (CRESTOR) 40 MG tablet Take 1 tablet (40 mg total) by mouth daily. 90 tablet 3  . tamsulosin (FLOMAX) 0.4 MG CAPS capsule Take 0.4 mg by mouth.     No facility-administered medications prior to visit.    No Known Allergies  ROS Review of Systems  Constitutional: Negative.   HENT: Negative.   Eyes: Negative for photophobia and visual disturbance.  Respiratory: Negative.   Cardiovascular: Negative.   Gastrointestinal: Negative.   Endocrine: Negative for polyphagia and polyuria.  Genitourinary: Positive for difficulty urinating and frequency. Negative for hematuria  and urgency.  Musculoskeletal: Negative for gait problem and joint swelling.  Neurological: Negative for speech difficulty and weakness.  Psychiatric/Behavioral: Positive for dysphoric mood. Negative for self-injury. The patient is nervous/anxious.       Objective:    Physical Exam Vitals and nursing note reviewed.  Constitutional:      General: He is not in acute distress.    Appearance: Normal appearance. He is normal weight. He is not ill-appearing, toxic-appearing or diaphoretic.  HENT:     Head: Normocephalic and atraumatic.     Right Ear: External ear normal.     Left Ear: External ear normal.     Mouth/Throat:     Mouth: Mucous membranes are moist.     Pharynx: Oropharynx is clear. No oropharyngeal exudate or posterior oropharyngeal erythema.  Eyes:     General: No scleral icterus.       Right eye: No  discharge.        Left eye: No discharge.     Extraocular Movements: Extraocular movements intact.     Conjunctiva/sclera: Conjunctivae normal.     Pupils: Pupils are equal, round, and reactive to light.  Cardiovascular:     Rate and Rhythm: Normal rate and regular rhythm.  Pulmonary:     Effort: Pulmonary effort is normal.     Breath sounds: Normal breath sounds.  Abdominal:     General: Bowel sounds are normal.     Hernia: There is no hernia in the left inguinal area or right inguinal area.  Genitourinary:    Penis: Circumcised. No hypospadias, erythema, tenderness, discharge, swelling or lesions.      Testes:        Right: Tenderness (mild) present. Mass or swelling not present. Right testis is descended.        Left: Mass, tenderness or swelling not present. Left testis is descended.     Epididymis:     Right: Not inflamed.     Left: Not inflamed.     Prostate: Enlarged. Not tender and no nodules present.     Rectum: Guaiac result negative. No mass, tenderness, anal fissure, external hemorrhoid or internal hemorrhoid. Normal anal tone.  Musculoskeletal:     Cervical back: No rigidity or tenderness.  Lymphadenopathy:     Cervical: No cervical adenopathy.     Lower Body: No right inguinal adenopathy. No left inguinal adenopathy.  Skin:    General: Skin is warm and dry.  Neurological:     Mental Status: He is alert and oriented to person, place, and time.  Psychiatric:        Mood and Affect: Mood normal.        Behavior: Behavior normal.     Temp (!) 97.2 F (36.2 C) (Temporal)   Ht '5\' 7"'  (1.702 m)   Wt 184 lb 9.6 oz (83.7 kg)   BMI 28.91 kg/m  Wt Readings from Last 3 Encounters:  12/11/20 184 lb 9.6 oz (83.7 kg)  08/04/20 180 lb (81.6 kg)  07/07/20 190 lb (86.2 kg)     Health Maintenance Due  Topic Date Due  . Hepatitis C Screening  Never done  . COLONOSCOPY (Pts 45-55yr Insurance coverage will need to be confirmed)  Never done    There are no preventive  care reminders to display for this patient.  Lab Results  Component Value Date   TSH 1.71 05/06/2020   Lab Results  Component Value Date   WBC 5.2 12/11/2020   HGB 12.6 (L) 12/11/2020  HCT 37.4 (L) 12/11/2020   MCV 91 12/11/2020   PLT 253 12/11/2020   Lab Results  Component Value Date   NA 140 12/11/2020   K 3.8 12/11/2020   CO2 22 12/11/2020   GLUCOSE 93 12/11/2020   BUN 17 12/11/2020   CREATININE 1.05 12/11/2020   BILITOT 0.6 12/11/2020   ALKPHOS 42 (L) 12/11/2020   AST 15 12/11/2020   ALT 21 12/11/2020   PROT 6.0 12/11/2020   ALBUMIN 4.2 12/11/2020   CALCIUM 8.5 (L) 12/11/2020   EGFR 84 12/11/2020   GFR 75.20 05/06/2020   Lab Results  Component Value Date   CHOL 124 12/11/2020   Lab Results  Component Value Date   HDL 38 (L) 12/11/2020   Lab Results  Component Value Date   LDLCALC 71 12/11/2020   Lab Results  Component Value Date   TRIG 77 12/11/2020   Lab Results  Component Value Date   CHOLHDL 3.3 12/11/2020   No results found for: HGBA1C    Assessment & Plan:   Problem List Items Addressed This Visit      Cardiovascular and Mediastinum   Essential hypertension - Primary   Relevant Medications   losartan (COZAAR) 50 MG tablet   rosuvastatin (CRESTOR) 40 MG tablet   Other Relevant Orders   Comprehensive metabolic panel (Completed)   CBC (Completed)   Microscopic Examination (Completed)     Genitourinary   Benign prostatic hyperplasia with urinary hesitancy   Relevant Medications   tamsulosin (FLOMAX) 0.4 MG CAPS capsule   Other Relevant Orders   Urinalysis, Routine w reflex microscopic (Completed)     Other   Elevated LDL cholesterol level   Relevant Medications   rosuvastatin (CRESTOR) 40 MG tablet   Other Relevant Orders   Lipid panel (Completed)   LDL cholesterol, direct (Completed)   Comprehensive metabolic panel (Completed)   Depression, major, single episode, moderate (HCC)   Relevant Medications   PARoxetine (PAXIL) 10  MG tablet    Other Visit Diagnoses    Healthcare maintenance       Relevant Orders   Ambulatory referral to Gastroenterology      Meds ordered this encounter  Medications  . tamsulosin (FLOMAX) 0.4 MG CAPS capsule    Sig: Take 1 capsule (0.4 mg total) by mouth daily after supper.    Dispense:  90 capsule    Refill:  4  . losartan (COZAAR) 50 MG tablet    Sig: Take 1 tablet (50 mg total) by mouth daily.    Dispense:  90 tablet    Refill:  3  . rosuvastatin (CRESTOR) 40 MG tablet    Sig: Take 1 tablet (40 mg total) by mouth daily.    Dispense:  90 tablet    Refill:  3  . PARoxetine (PAXIL) 10 MG tablet    Sig: Take one each day for 7 days and then increase to 2 each day.    Dispense:  60 tablet    Refill:  1    Follow-up: Return in about 7 weeks (around 01/29/2021).   Blood pressure control looks good.  We will restart Flomax for BPH symptoms.  Urine is pending.  Last PSA was normal.  We will start Paxil and adjust time of day he takes it.  Follow-up in 7 weeks.  Please let me know if there are any issues with it. Libby Maw, MD   5/9 addendum:  Hgb is a little low. Discuss next visit. Time for  first colonoscopy.

## 2020-12-11 NOTE — Patient Instructions (Signed)

## 2020-12-11 NOTE — Addendum Note (Signed)
Addended by: Beryle Lathe S on: 12/11/2020 09:03 AM   Modules accepted: Orders

## 2020-12-12 LAB — COMPREHENSIVE METABOLIC PANEL
ALT: 21 IU/L (ref 0–44)
AST: 15 IU/L (ref 0–40)
Albumin/Globulin Ratio: 2.3 — ABNORMAL HIGH (ref 1.2–2.2)
Albumin: 4.2 g/dL (ref 3.8–4.9)
Alkaline Phosphatase: 42 IU/L — ABNORMAL LOW (ref 44–121)
BUN/Creatinine Ratio: 16 (ref 9–20)
BUN: 17 mg/dL (ref 6–24)
Bilirubin Total: 0.6 mg/dL (ref 0.0–1.2)
CO2: 22 mmol/L (ref 20–29)
Calcium: 8.5 mg/dL — ABNORMAL LOW (ref 8.7–10.2)
Chloride: 103 mmol/L (ref 96–106)
Creatinine, Ser: 1.05 mg/dL (ref 0.76–1.27)
Globulin, Total: 1.8 g/dL (ref 1.5–4.5)
Glucose: 93 mg/dL (ref 65–99)
Potassium: 3.8 mmol/L (ref 3.5–5.2)
Sodium: 140 mmol/L (ref 134–144)
Total Protein: 6 g/dL (ref 6.0–8.5)
eGFR: 84 mL/min/{1.73_m2} (ref >59)

## 2020-12-12 LAB — CBC
Hematocrit: 37.4 % — ABNORMAL LOW (ref 37.5–51.0)
Hemoglobin: 12.6 g/dL — ABNORMAL LOW (ref 13.0–17.7)
MCH: 30.8 pg (ref 26.6–33.0)
MCHC: 33.7 g/dL (ref 31.5–35.7)
MCV: 91 fL (ref 79–97)
Platelets: 253 10*3/uL (ref 150–450)
RBC: 4.09 x10E6/uL — ABNORMAL LOW (ref 4.14–5.80)
RDW: 12.2 % (ref 11.6–15.4)
WBC: 5.2 10*3/uL (ref 3.4–10.8)

## 2020-12-12 LAB — URINALYSIS, ROUTINE W REFLEX MICROSCOPIC
Bilirubin, UA: NEGATIVE
Glucose, UA: NEGATIVE
Ketones, UA: NEGATIVE
Leukocytes,UA: NEGATIVE
Nitrite, UA: NEGATIVE
Protein,UA: NEGATIVE
Specific Gravity, UA: 1.016 (ref 1.005–1.030)
Urobilinogen, Ur: 1 mg/dL (ref 0.2–1.0)
pH, UA: 7.5 (ref 5.0–7.5)

## 2020-12-12 LAB — LIPID PANEL
Chol/HDL Ratio: 3.3 ratio (ref 0.0–5.0)
Cholesterol, Total: 124 mg/dL (ref 100–199)
HDL: 38 mg/dL — ABNORMAL LOW (ref >39)
LDL Chol Calc (NIH): 71 mg/dL (ref 0–99)
Triglycerides: 77 mg/dL (ref 0–149)
VLDL Cholesterol Cal: 15 mg/dL (ref 5–40)

## 2020-12-12 LAB — LDL CHOLESTEROL, DIRECT: LDL Direct: 71 mg/dL (ref 0–99)

## 2020-12-12 LAB — MICROSCOPIC EXAMINATION
Bacteria, UA: NONE SEEN
Casts: NONE SEEN /lpf
Epithelial Cells (non renal): NONE SEEN /hpf (ref 0–10)
RBC, Urine: NONE SEEN /hpf (ref 0–2)
WBC, UA: NONE SEEN /hpf (ref 0–5)

## 2020-12-16 ENCOUNTER — Encounter: Payer: Self-pay | Admitting: Family Medicine

## 2020-12-17 NOTE — Addendum Note (Signed)
Addended by: Jon Billings on: 12/17/2020 10:27 AM   Modules accepted: Orders

## 2020-12-27 ENCOUNTER — Other Ambulatory Visit: Payer: Self-pay | Admitting: Family Medicine

## 2020-12-27 DIAGNOSIS — F321 Major depressive disorder, single episode, moderate: Secondary | ICD-10-CM

## 2021-01-28 ENCOUNTER — Other Ambulatory Visit: Payer: Self-pay | Admitting: Family Medicine

## 2021-01-28 DIAGNOSIS — F321 Major depressive disorder, single episode, moderate: Secondary | ICD-10-CM

## 2021-01-28 NOTE — Telephone Encounter (Signed)
Has appt 01/29/21 w/Kremer  will discuss then. Dm/cma

## 2021-01-29 ENCOUNTER — Ambulatory Visit: Payer: Managed Care, Other (non HMO) | Admitting: Family Medicine

## 2021-01-29 ENCOUNTER — Encounter: Payer: Self-pay | Admitting: Gastroenterology

## 2021-01-29 ENCOUNTER — Encounter: Payer: Self-pay | Admitting: Family Medicine

## 2021-01-29 ENCOUNTER — Other Ambulatory Visit: Payer: Self-pay

## 2021-01-29 ENCOUNTER — Other Ambulatory Visit: Payer: Self-pay | Admitting: Family Medicine

## 2021-01-29 VITALS — BP 122/80 | HR 89 | Temp 97.7°F | Ht 67.0 in | Wt 182.4 lb

## 2021-01-29 DIAGNOSIS — M25521 Pain in right elbow: Secondary | ICD-10-CM

## 2021-01-29 DIAGNOSIS — M19041 Primary osteoarthritis, right hand: Secondary | ICD-10-CM

## 2021-01-29 DIAGNOSIS — M25421 Effusion, right elbow: Secondary | ICD-10-CM

## 2021-01-29 DIAGNOSIS — R3129 Other microscopic hematuria: Secondary | ICD-10-CM

## 2021-01-29 DIAGNOSIS — Z0000 Encounter for general adult medical examination without abnormal findings: Secondary | ICD-10-CM | POA: Diagnosis not present

## 2021-01-29 DIAGNOSIS — K219 Gastro-esophageal reflux disease without esophagitis: Secondary | ICD-10-CM

## 2021-01-29 DIAGNOSIS — I10 Essential (primary) hypertension: Secondary | ICD-10-CM | POA: Diagnosis not present

## 2021-01-29 DIAGNOSIS — E538 Deficiency of other specified B group vitamins: Secondary | ICD-10-CM

## 2021-01-29 DIAGNOSIS — E7800 Pure hypercholesterolemia, unspecified: Secondary | ICD-10-CM

## 2021-01-29 DIAGNOSIS — N401 Enlarged prostate with lower urinary tract symptoms: Secondary | ICD-10-CM

## 2021-01-29 DIAGNOSIS — R3911 Hesitancy of micturition: Secondary | ICD-10-CM

## 2021-01-29 DIAGNOSIS — D649 Anemia, unspecified: Secondary | ICD-10-CM | POA: Insufficient documentation

## 2021-01-29 DIAGNOSIS — F321 Major depressive disorder, single episode, moderate: Secondary | ICD-10-CM

## 2021-01-29 DIAGNOSIS — M19042 Primary osteoarthritis, left hand: Secondary | ICD-10-CM

## 2021-01-29 HISTORY — DX: Pain in right elbow: M25.521

## 2021-01-29 HISTORY — DX: Other microscopic hematuria: R31.29

## 2021-01-29 MED ORDER — PAROXETINE HCL 20 MG PO TABS: 20.0000 mg | ORAL_TABLET | Freq: Every day | ORAL | 1 refills | Status: DC

## 2021-01-29 MED ORDER — RANITIDINE HCL 300 MG PO TABS: 300.0000 mg | ORAL_TABLET | Freq: Every day | ORAL | 1 refills | Status: DC

## 2021-01-29 MED ORDER — MELOXICAM 7.5 MG PO TABS: ORAL_TABLET | ORAL | 0 refills | Status: DC

## 2021-01-29 NOTE — Addendum Note (Signed)
Addended by: Renette Butters on: 01/29/2021 09:27 AM   Modules accepted: Orders

## 2021-01-29 NOTE — Telephone Encounter (Signed)
Pharmacy comment: Alternative Requested:ZANTAC HAS BEEN DISCONTINUED. PLEASE SEND IN AN ALTERNATIVE.   Please review and advise.  Thanks. Dm/cma

## 2021-01-29 NOTE — Progress Notes (Addendum)
Established Patient Office Visit  Subjective:  Patient ID: Harold Morgan, male    DOB: 1966/04/24  Age: 55 y.o. MRN: [id number]  CC:  Chief Complaint  Patient presents with   Follow-up    Follow up on BP concerns about frequent heartburn episodes.     HPI Pope Brunty Sisley presents for follow-up of depression, arthritis of pends, hematuria, hypertension and normocytic anemia.  Patient does have a history of kidney stones and thinks that he may have been in the process of passing 1 and he saw me last.  He has never had a colonoscopy and is ready to go ahead with that procedure.  Blood pressures been well controlled with the losartan.  Having no issues taking it.  Paxil is helping a great deal and would like to continue it.  He has been taking Indocin nightly for arthritis in his hands and about his body.  He has no history of gout as far as he knows.  He does have reflux from time to time.  Tamsulosin has been helping his urine flow.  It is effective for him used as needed.  Past Medical History:  Diagnosis Date   Heartburn    HLD (hyperlipidemia)    HTN (hypertension)    Kidney stone     Past Surgical History:  Procedure Laterality Date   LITHOTRIPSY     TONSILLECTOMY      Family History  Problem Relation Age of Onset   Cancer Sister        lung caner, tobacco use   Emphysema Father    Aneurysm Maternal Uncle 46       brain   Bladder Cancer Neg Hx    Prostate cancer Neg Hx     Social History   Socioeconomic History   Marital status: Married    Spouse name: Not on file   Number of children: 2   Years of education: Not on file   Highest education level: Not on file  Occupational History   Not on file  Tobacco Use   Smoking status: Never   Smokeless tobacco: Former    Types: Chew  Substance and Sexual Activity   Alcohol use: Yes    Comment: social   Drug use: No   Sexual activity: Yes  Other Topics Concern   Not on file  Social History Narrative   Not on  file   Social Determinants of Health   Financial Resource Strain: Not on file  Food Insecurity: Not on file  Transportation Needs: Not on file  Physical Activity: Not on file  Stress: Not on file  Social Connections: Not on file  Intimate Partner Violence: Not on file    Outpatient Medications Prior to Visit  Medication Sig Dispense Refill   losartan (COZAAR) 50 MG tablet Take 1 tablet (50 mg total) by mouth daily. 90 tablet 3   rosuvastatin (CRESTOR) 40 MG tablet Take 1 tablet (40 mg total) by mouth daily. 90 tablet 3   tamsulosin (FLOMAX) 0.4 MG CAPS capsule Take 1 capsule (0.4 mg total) by mouth daily after supper. 90 capsule 4   indomethacin (INDOCIN) 50 MG capsule Take 1 capsule (50 mg total) by mouth 3 (three) times daily with meals. 90 capsule 0   PARoxetine (PAXIL) 10 MG tablet Take one each day for 7 days and then increase to 2 each day. 60 tablet 1   No facility-administered medications prior to visit.    No Known Allergies  ROS  Review of Systems  Constitutional: Negative.   HENT: Negative.    Respiratory: Negative.    Cardiovascular: Negative.   Gastrointestinal: Negative.   Genitourinary:  Negative for difficulty urinating, frequency and urgency.  Musculoskeletal:  Positive for arthralgias. Negative for gait problem.  Psychiatric/Behavioral:  Negative for dysphoric mood. The patient is not nervous/anxious.      Objective:    Physical Exam Vitals and nursing note reviewed.  Constitutional:      General: He is not in acute distress.    Appearance: He is not ill-appearing, toxic-appearing or diaphoretic.  HENT:     Head: Normocephalic and atraumatic.     Right Ear: Tympanic membrane, ear canal and external ear normal.     Left Ear: Tympanic membrane, ear canal and external ear normal.     Mouth/Throat:     Mouth: Mucous membranes are moist.     Pharynx: Oropharynx is clear. No oropharyngeal exudate or posterior oropharyngeal erythema.  Eyes:     General:  No scleral icterus.       Right eye: No discharge.        Left eye: No discharge.     Extraocular Movements: Extraocular movements intact.     Conjunctiva/sclera: Conjunctivae normal.     Pupils: Pupils are equal, round, and reactive to light.  Cardiovascular:     Rate and Rhythm: Normal rate and regular rhythm.  Pulmonary:     Effort: Pulmonary effort is normal.     Breath sounds: Normal breath sounds.  Musculoskeletal:     Cervical back: No rigidity or tenderness.  Lymphadenopathy:     Cervical: No cervical adenopathy.  Skin:    General: Skin is warm and dry.  Neurological:     Mental Status: He is alert and oriented to person, place, and time.  Psychiatric:        Mood and Affect: Mood normal.        Behavior: Behavior normal.    BP 122/80   Pulse 89   Temp 97.7 F (36.5 C) (Temporal)   Ht '5\' 7"'  (1.702 m)   Wt 182 lb 6.4 oz (82.7 kg)   SpO2 97%   BMI 28.57 kg/m  Wt Readings from Last 3 Encounters:  01/29/21 182 lb 6.4 oz (82.7 kg)  12/11/20 184 lb 9.6 oz (83.7 kg)  08/04/20 180 lb (81.6 kg)     Health Maintenance Due  Topic Date Due   Hepatitis C Screening  Never done   COLONOSCOPY (Pts 45-35yr Insurance coverage will need to be confirmed)  Never done   Zoster Vaccines- Shingrix (1 of 2) Never done    There are no preventive care reminders to display for this patient.  Lab Results  Component Value Date   TSH 1.71 05/06/2020   Lab Results  Component Value Date   WBC 4.5 01/29/2021   HGB 13.2 01/29/2021   HCT 39.3 01/29/2021   MCV 91 01/29/2021   PLT 286 01/29/2021   Lab Results  Component Value Date   NA 141 01/29/2021   K 4.2 01/29/2021   CO2 23 01/29/2021   GLUCOSE 92 01/29/2021   BUN 24 01/29/2021   CREATININE 1.08 01/29/2021   BILITOT 0.6 12/11/2020   ALKPHOS 42 (L) 12/11/2020   AST 15 12/11/2020   ALT 21 12/11/2020   PROT 6.0 12/11/2020   ALBUMIN 4.2 12/11/2020   CALCIUM 9.0 01/29/2021   EGFR 82 01/29/2021   GFR 75.20 05/06/2020    Lab Results  Component  Value Date   CHOL 124 12/11/2020   Lab Results  Component Value Date   HDL 38 (L) 12/11/2020   Lab Results  Component Value Date   LDLCALC 71 12/11/2020   Lab Results  Component Value Date   TRIG 77 12/11/2020   Lab Results  Component Value Date   CHOLHDL 3.3 12/11/2020   No results found for: HGBA1C    Assessment & Plan:   Problem List Items Addressed This Visit       Cardiovascular and Mediastinum   Essential hypertension   Relevant Orders   Basic metabolic panel (Completed)     Digestive   Gastroesophageal reflux disease     Musculoskeletal and Integument   Arthritis of both hands - Primary   Relevant Medications   meloxicam (MOBIC) 7.5 MG tablet   Other Relevant Orders   Urinalysis, Routine w reflex microscopic (Completed)     Genitourinary   Benign prostatic hyperplasia with urinary hesitancy   Other microscopic hematuria   Relevant Orders   Urinalysis, Routine w reflex microscopic (Completed)     Other   Elevated LDL cholesterol level   Depression, major, single episode, moderate (HCC)   Relevant Medications   PARoxetine (PAXIL) 20 MG tablet   Pain and swelling of right elbow   Relevant Orders   Uric acid (Completed)   Anemia   Relevant Medications   vitamin B-12 (CYANOCOBALAMIN) 1000 MCG tablet   Other Relevant Orders   CBC (Completed)   Iron, TIBC and Ferritin Panel (Completed)   Vitamin B12 (Completed)   B12 deficiency   Relevant Medications   vitamin B-12 (CYANOCOBALAMIN) 1000 MCG tablet   Healthcare maintenance   Relevant Orders   Ambulatory referral to Gastroenterology    Meds ordered this encounter  Medications   meloxicam (MOBIC) 7.5 MG tablet    Sig: May take one daily as needed for arthritic pain with food.    Dispense:  30 tablet    Refill:  0   PARoxetine (PAXIL) 20 MG tablet    Sig: Take 1 tablet (20 mg total) by mouth daily.    Dispense:  90 tablet    Refill:  1   DISCONTD: ranitidine  (ZANTAC) 300 MG tablet    Sig: Take 1 tablet (300 mg total) by mouth at bedtime.    Dispense:  90 tablet    Refill:  1   vitamin B-12 (CYANOCOBALAMIN) 1000 MCG tablet    Sig: Take 1 tablet (1,000 mcg total) by mouth daily.    Dispense:  90 tablet    Refill:  4    Follow-up: Return return 3-6 months or sooner as needed..  Will continue Paxil at 20 mg.  Have switched to meloxicam from Indocin.  Hopefully this may help his reflux.  We will use Zantac's as needed for reflux.  Continue tamsulosin as needed for urinary hesitancy.  Continue losartan for hypertension.  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

## 2021-01-30 DIAGNOSIS — Z0000 Encounter for general adult medical examination without abnormal findings: Secondary | ICD-10-CM | POA: Insufficient documentation

## 2021-01-30 DIAGNOSIS — Z1211 Encounter for screening for malignant neoplasm of colon: Secondary | ICD-10-CM

## 2021-01-30 DIAGNOSIS — E538 Deficiency of other specified B group vitamins: Secondary | ICD-10-CM | POA: Insufficient documentation

## 2021-01-30 DIAGNOSIS — K219 Gastro-esophageal reflux disease without esophagitis: Secondary | ICD-10-CM | POA: Insufficient documentation

## 2021-01-30 HISTORY — DX: Gastro-esophageal reflux disease without esophagitis: K21.9

## 2021-01-30 HISTORY — DX: Encounter for screening for malignant neoplasm of colon: Z12.11

## 2021-01-30 HISTORY — DX: Deficiency of other specified B group vitamins: E53.8

## 2021-01-30 LAB — BASIC METABOLIC PANEL
BUN/Creatinine Ratio: 22 — ABNORMAL HIGH (ref 9–20)
BUN: 24 mg/dL (ref 6–24)
CO2: 23 mmol/L (ref 20–29)
Calcium: 9 mg/dL (ref 8.7–10.2)
Chloride: 105 mmol/L (ref 96–106)
Creatinine, Ser: 1.08 mg/dL (ref 0.76–1.27)
Glucose: 92 mg/dL (ref 65–99)
Potassium: 4.2 mmol/L (ref 3.5–5.2)
Sodium: 141 mmol/L (ref 134–144)
eGFR: 82 mL/min/{1.73_m2} (ref >59)

## 2021-01-30 LAB — URINALYSIS, ROUTINE W REFLEX MICROSCOPIC
Bilirubin, UA: NEGATIVE
Glucose, UA: NEGATIVE
Ketones, UA: NEGATIVE
Leukocytes,UA: NEGATIVE
Nitrite, UA: NEGATIVE
RBC, UA: NEGATIVE
Specific Gravity, UA: >=1.03 — AB (ref 1.005–1.030)
Urobilinogen, Ur: 1 mg/dL (ref 0.2–1.0)
pH, UA: 5.5 (ref 5.0–7.5)

## 2021-01-30 LAB — IRON,TIBC AND FERRITIN PANEL
Ferritin: 181 ng/mL (ref 30–400)
Iron Saturation: 37 % (ref 15–55)
Iron: 105 ug/dL (ref 38–169)
Total Iron Binding Capacity: 283 ug/dL (ref 250–450)
UIBC: 178 ug/dL (ref 111–343)

## 2021-01-30 LAB — MICROSCOPIC EXAMINATION
Bacteria, UA: NONE SEEN
Casts: NONE SEEN /lpf
WBC, UA: NONE SEEN /hpf (ref 0–5)

## 2021-01-30 LAB — CBC
Hematocrit: 39.3 % (ref 37.5–51.0)
Hemoglobin: 13.2 g/dL (ref 13.0–17.7)
MCH: 30.7 pg (ref 26.6–33.0)
MCHC: 33.6 g/dL (ref 31.5–35.7)
MCV: 91 fL (ref 79–97)
Platelets: 286 10*3/uL (ref 150–450)
RBC: 4.3 x10E6/uL (ref 4.14–5.80)
RDW: 12.9 % (ref 11.6–15.4)
WBC: 4.5 10*3/uL (ref 3.4–10.8)

## 2021-01-30 LAB — VITAMIN B12: Vitamin B-12: 223 pg/mL — ABNORMAL LOW (ref 232–1245)

## 2021-01-30 LAB — URIC ACID: Uric Acid: 4.9 mg/dL (ref 3.8–8.4)

## 2021-01-30 MED ORDER — VITAMIN B-12 1000 MCG PO TABS: 1000.0000 ug | ORAL_TABLET | Freq: Every day | ORAL | 4 refills | Status: DC

## 2021-01-30 NOTE — Addendum Note (Signed)
Addended by: Jon Billings on: 01/30/2021 01:17 PM   Modules accepted: Orders

## 2021-02-09 ENCOUNTER — Encounter: Payer: Self-pay | Admitting: Family Medicine

## 2021-02-25 ENCOUNTER — Other Ambulatory Visit: Payer: Self-pay | Admitting: Family Medicine

## 2021-02-25 DIAGNOSIS — M19041 Primary osteoarthritis, right hand: Secondary | ICD-10-CM

## 2021-03-06 ENCOUNTER — Other Ambulatory Visit: Payer: Self-pay | Admitting: Family Medicine

## 2021-03-06 DIAGNOSIS — F321 Major depressive disorder, single episode, moderate: Secondary | ICD-10-CM

## 2021-03-06 MED ORDER — PAROXETINE HCL 20 MG PO TABS: 20.0000 mg | ORAL_TABLET | Freq: Every day | ORAL | 1 refills | Status: DC

## 2021-03-27 ENCOUNTER — Other Ambulatory Visit: Payer: Self-pay

## 2021-03-27 ENCOUNTER — Ambulatory Visit (AMBULATORY_SURGERY_CENTER): Payer: Managed Care, Other (non HMO)

## 2021-03-27 VITALS — Ht 67.0 in | Wt 182.0 lb

## 2021-03-27 DIAGNOSIS — Z1211 Encounter for screening for malignant neoplasm of colon: Secondary | ICD-10-CM

## 2021-03-27 MED ORDER — GOLYTELY 236 G PO SOLR: 4000.0000 mL | ORAL | 0 refills | Status: DC

## 2021-03-27 NOTE — Progress Notes (Signed)
Pre visit completed via phone call; Patient verified name, DOB, and address; No egg or soy allergy known to patient  No issues with past sedation with any surgeries or procedures Patient denies ever being told they had issues or difficulty with intubation  No FH of Malignant Hyperthermia No diet pills per patient No home 02 use per patient  No blood thinners per patient  Pt denies issues with constipation  No A fib or A flutter  EMMI video via MyChart  COVID 19 guidelines implemented in PV today with Pt and RN  Pt is fully vaccinated for Covid x 2 + booster;  NO PA's for preps discussed with pt in PV today  Discussed with pt there will be an out-of-pocket cost for prep and that varies from $0 to 70 dollars   Due to the COVID-19 pandemic we are asking patients to follow certain guidelines.  Pt aware of COVID protocols and [facility] guidelines

## 2021-04-04 ENCOUNTER — Other Ambulatory Visit: Payer: Self-pay | Admitting: Family Medicine

## 2021-04-04 DIAGNOSIS — M19041 Primary osteoarthritis, right hand: Secondary | ICD-10-CM

## 2021-04-04 DIAGNOSIS — M19042 Primary osteoarthritis, left hand: Secondary | ICD-10-CM

## 2021-04-17 ENCOUNTER — Encounter: Payer: Managed Care, Other (non HMO) | Admitting: Gastroenterology

## 2021-05-18 ENCOUNTER — Encounter: Payer: Self-pay | Admitting: Family Medicine

## 2021-05-18 DIAGNOSIS — M19042 Primary osteoarthritis, left hand: Secondary | ICD-10-CM

## 2021-05-18 DIAGNOSIS — M19041 Primary osteoarthritis, right hand: Secondary | ICD-10-CM

## 2021-05-18 MED ORDER — DICLOFENAC SODIUM 1 % EX GEL: 2.0000 g | Freq: Three times a day (TID) | CUTANEOUS | 1 refills | Status: DC | PRN

## 2021-05-26 ENCOUNTER — Ambulatory Visit
Admission: RE | Admit: 2021-05-26 | Discharge: 2021-05-26 | Disposition: A | Discharge status: Home / Self Care | Payer: Managed Care, Other (non HMO) | Source: Ambulatory Visit | Attending: Sports Medicine | Admitting: Sports Medicine

## 2021-05-26 ENCOUNTER — Ambulatory Visit: Payer: Managed Care, Other (non HMO) | Admitting: Sports Medicine

## 2021-05-26 VITALS — BP 124/90 | Ht 67.0 in | Wt 187.0 lb

## 2021-05-26 DIAGNOSIS — M79641 Pain in right hand: Secondary | ICD-10-CM | POA: Diagnosis not present

## 2021-05-26 DIAGNOSIS — M79642 Pain in left hand: Secondary | ICD-10-CM

## 2021-05-26 MED ORDER — DICLOFENAC SODIUM 75 MG PO TBEC: 75.0000 mg | DELAYED_RELEASE_TABLET | Freq: Two times a day (BID) | ORAL | 0 refills | Status: DC | PRN

## 2021-05-27 NOTE — Progress Notes (Signed)
   Subjective:    Patient ID: Harold Morgan, male    DOB: [birth year], 55 y.o.   MRN: [id number]  HPI chief complaint: Bilateral hand pain  Very pleasant 55 year old male comes in today complaining of bilateral hand pain this been present for several years.  Pain is worse in the right hand.  He is right-hand dominant.  His pain is diffuse through both hands but is worse at the base of each thumb.  His primary care physician placed him on indomethacin a couple of years ago which was extremely helpful.  However, he was changed eventually to Mobic out of concern about GI upset with chronic indomethacin.  Mobic has not been helpful.  He describes stiffness in both hands.  He has had injuries to the right hand in the past, including fracture of the right fifth finger and right thumb.  These were both remote injuries.  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  He is here today with his wife.  Past medical history reviewed.  Family history also reviewed.  There is no family history of inflammatory arthropathy. 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 Medications reviewed Allergies reviewed    Review of Systems As above    Objective:   Physical Exam  Well-developed, well-nourished.  No acute distress  Examination of both hands show some hypertrophy of multiple PIP and DIP joints.  He is tender to palpation at the [facility] joint bilaterally.  Positive CMC grind bilaterally.  Negative Finkelstein's.  No soft tissue swelling.  Good pulses.      Assessment & Plan:   Chronic bilateral hand pain likely secondary to osteoarthritis  I would like to get x-rays of both hands including CMC views of both thumbs.  I am going to change his meloxicam to Voltaren 75 mg twice daily with food.  I will call him in 2 to 3 days to discuss his x-ray results.  We will also see how he is  responding to the Voltaren.  I explained to both him and his wife that I do not mind prescribing a short course of indomethacin if needed since that has helped him in the past.  I did review his labs today including his creatinine and BUN.  Renal and hepatic function are within normal limits.  I cautioned him about GI upset with any oral NSAID.  We will also fit him with to [facility] splints which should be flexible enough to allow him to wear them at work.  This note was dictated using Dragon naturally speaking software and may contain errors in syntax, spelling, or content which have not been identified prior to signing this note.

## 2021-06-01 ENCOUNTER — Telehealth: Payer: Self-pay | Admitting: Sports Medicine

## 2021-06-01 NOTE — Telephone Encounter (Signed)
  I spoke with Harold Morgan his wife, Harold Morgan, on the phone last Friday.  We discussed x-ray findings of both of his hands.  He does have mild to moderate degenerative changes, particularly in his CMC joints.  He only recently started Voltaren that she is not able to attest as to whether or not that has been helpful.  He has found his Santel braces to be helpful though.  I asked Harold Morgan to call me next week with an update.  As mentioned in a previous note, Harold Morgan has done well with indomethacin in the past so if Voltaren is not helpful I will give him a refill of that.  Review of his chart shows that his PCP may have discontinued the indomethacin as a result of gastric reflux but Harold Morgan explains to me that discontinuation of that medicine did not seem to affect his reflux much.

## 2021-06-22 ENCOUNTER — Other Ambulatory Visit: Payer: Self-pay | Admitting: Family Medicine

## 2021-06-22 DIAGNOSIS — I10 Essential (primary) hypertension: Secondary | ICD-10-CM

## 2021-06-29 ENCOUNTER — Ambulatory Visit: Payer: Managed Care, Other (non HMO) | Admitting: Family Medicine

## 2021-07-24 ENCOUNTER — Other Ambulatory Visit: Payer: Self-pay | Admitting: Sports Medicine

## 2021-08-20 ENCOUNTER — Other Ambulatory Visit: Payer: Self-pay | Admitting: Sports Medicine

## 2021-09-01 ENCOUNTER — Other Ambulatory Visit: Payer: Self-pay | Admitting: Family Medicine

## 2021-09-01 DIAGNOSIS — F321 Major depressive disorder, single episode, moderate: Secondary | ICD-10-CM

## 2021-09-17 ENCOUNTER — Other Ambulatory Visit: Payer: Self-pay | Admitting: Sports Medicine

## 2021-09-17 ENCOUNTER — Encounter: Payer: Self-pay | Admitting: Sports Medicine

## 2021-09-17 MED ORDER — DICLOFENAC SODIUM 75 MG PO TBEC: 75.0000 mg | DELAYED_RELEASE_TABLET | Freq: Two times a day (BID) | ORAL | 0 refills | Status: DC | PRN

## 2021-09-30 ENCOUNTER — Other Ambulatory Visit: Payer: Self-pay

## 2021-09-30 DIAGNOSIS — E7800 Pure hypercholesterolemia, unspecified: Secondary | ICD-10-CM

## 2021-09-30 MED ORDER — ROSUVASTATIN CALCIUM 40 MG PO TABS: 40.0000 mg | ORAL_TABLET | Freq: Every day | ORAL | 0 refills | Status: DC

## 2021-10-01 ENCOUNTER — Other Ambulatory Visit: Payer: Self-pay | Admitting: Family Medicine

## 2021-10-01 DIAGNOSIS — E7800 Pure hypercholesterolemia, unspecified: Secondary | ICD-10-CM

## 2021-10-14 ENCOUNTER — Other Ambulatory Visit: Payer: Self-pay | Admitting: Sports Medicine

## 2021-10-15 ENCOUNTER — Ambulatory Visit: Payer: Managed Care, Other (non HMO) | Admitting: Family Medicine

## 2021-10-30 ENCOUNTER — Ambulatory Visit: Payer: Managed Care, Other (non HMO) | Admitting: Family Medicine

## 2021-11-12 ENCOUNTER — Encounter: Payer: Self-pay | Admitting: Family Medicine

## 2021-11-12 ENCOUNTER — Ambulatory Visit: Payer: Managed Care, Other (non HMO) | Admitting: Family Medicine

## 2021-11-12 VITALS — BP 122/94 | HR 80 | Temp 97.6°F | Ht 67.0 in | Wt 196.6 lb

## 2021-11-12 DIAGNOSIS — K219 Gastro-esophageal reflux disease without esophagitis: Secondary | ICD-10-CM

## 2021-11-12 DIAGNOSIS — E7800 Pure hypercholesterolemia, unspecified: Secondary | ICD-10-CM

## 2021-11-12 DIAGNOSIS — D649 Anemia, unspecified: Secondary | ICD-10-CM

## 2021-11-12 DIAGNOSIS — R42 Dizziness and giddiness: Secondary | ICD-10-CM

## 2021-11-12 DIAGNOSIS — F321 Major depressive disorder, single episode, moderate: Secondary | ICD-10-CM

## 2021-11-12 DIAGNOSIS — R3911 Hesitancy of micturition: Secondary | ICD-10-CM

## 2021-11-12 DIAGNOSIS — I10 Essential (primary) hypertension: Secondary | ICD-10-CM

## 2021-11-12 DIAGNOSIS — R0683 Snoring: Secondary | ICD-10-CM

## 2021-11-12 DIAGNOSIS — N401 Enlarged prostate with lower urinary tract symptoms: Secondary | ICD-10-CM

## 2021-11-12 DIAGNOSIS — E538 Deficiency of other specified B group vitamins: Secondary | ICD-10-CM | POA: Diagnosis not present

## 2021-11-12 HISTORY — DX: Snoring: R06.83

## 2021-11-12 MED ORDER — OMEPRAZOLE 40 MG PO CPDR: 40.0000 mg | DELAYED_RELEASE_CAPSULE | Freq: Every day | ORAL | 2 refills | Status: DC

## 2021-11-12 NOTE — Addendum Note (Signed)
Addended by: Lynnea Ferrier on: 11/12/2021 09:35 AM ? ? Modules accepted: Orders ? ?

## 2021-11-12 NOTE — Progress Notes (Signed)
? ?Established Patient Office Visit ? ?Subjective:  ?Patient ID: Harold Morgan, male    DOB: 05-06-1966  Age: 56 y.o. MRN: [id number] ? ?CC:  ?Chief Complaint  ?Patient presents with  ? Dizziness  ?  Dizzy spells come and go this morning x 1 week not sure if BP meds need to be changed. Snoring a lot would like referral for sleep study.   ? ? ?HPI ?Harold Morgan presents for follow-up of hypertension, hyperlipidemia, B12 deficiency, depression, reflux disease, GERD.  He has been experiencing some episodes of lightheadedness when he stands up or moves his head suddenly.  Denies spinning sensation.  Denies URI symptoms.  Admits that he could do a better job hydrating himself.  GERD controlled with omeprazole.  Continues Paxil for depression.  It is working well for him.  Blood pressure runs at home about where is here today.  He does not drink alcohol or use illicit drugs.  Feels as though he could stand to lower the salt in his diet.  Snores with witnessed apneic episodes. ? ?Past Medical History:  ?Diagnosis Date  ? Anxiety   ? on meds  ? Arthritis   ? generalized  ? Depression   ? on meds  ? GERD (gastroesophageal reflux disease)   ? PRN meds- with certain foods  ? Heartburn   ? HLD (hyperlipidemia)   ? on meds  ? HTN (hypertension)   ? on meds  ? Kidney stone   ? ? ?Past Surgical History:  ?Procedure Laterality Date  ? LITHOTRIPSY    ? TONSILLECTOMY    ? TUMOR REMOVAL Left 2018  ? leg above knee- "sheet tumor"  ? ? ?Family History  ?Problem Relation Age of Onset  ? Emphysema Father   ? Cancer Sister   ?     lung caner, tobacco use  ? Aneurysm Maternal Uncle 60  ?     brain  ? Bladder Cancer Neg Hx   ? Prostate cancer Neg Hx   ? Colon polyps Neg Hx   ? Colon cancer Neg Hx   ? Esophageal cancer Neg Hx   ? Rectal cancer Neg Hx   ? ? ?Social History  ? ?Socioeconomic History  ? Marital status: Married  ?  Spouse name: Not on file  ? Number of children: 2  ? Years of education: Not on file  ? Highest education level:  Not on file  ?Occupational History  ? Not on file  ?Tobacco Use  ? Smoking status: Never  ? Smokeless tobacco: Former  ?  Types: Chew  ?Vaping Use  ? Vaping Use: Never used  ?Substance and Sexual Activity  ? Alcohol use: Yes  ?  Comment: special occasions  ? Drug use: No  ? Sexual activity: Yes  ?Other Topics Concern  ? Not on file  ?Social History Narrative  ? Not on file  ? ?Social Determinants of Health  ? ?Financial Resource Strain: Not on file  ?Food Insecurity: Not on file  ?Transportation Needs: Not on file  ?Physical Activity: Not on file  ?Stress: Not on file  ?Social Connections: Not on file  ?Intimate Partner Violence: Not on file  ? ? ?Outpatient Medications Prior to Visit  ?Medication Sig Dispense Refill  ? [employer] ACID CONTROLLER 10 MG tablet TAKE 1 TABLET BY MOUTH EVERYDAY AT BEDTIME 90 tablet 1  ? diclofenac (VOLTAREN) 75 MG EC tablet TAKE 1 TABLET (75 MG TOTAL) BY MOUTH 2 (TWO) TIMES DAILY AS  NEEDED. TAKE WITH FOOD. 60 tablet 0  ? losartan (COZAAR) 50 MG tablet TAKE 1 TABLET BY MOUTH EVERY DAY 90 tablet 3  ? PARoxetine (PAXIL) 20 MG tablet TAKE 1 TABLET BY MOUTH EVERY DAY 90 tablet 1  ? rosuvastatin (CRESTOR) 40 MG tablet Take 1 tablet (40 mg total) by mouth daily. Needs appointment for further refills 90 tablet 0  ? vitamin B-12 (CYANOCOBALAMIN) 1000 MCG tablet Take 1 tablet (1,000 mcg total) by mouth daily. 90 tablet 4  ? omeprazole (PRILOSEC) 40 MG capsule Take 40 mg by mouth daily.    ? tamsulosin (FLOMAX) 0.4 MG CAPS capsule Take 1 capsule (0.4 mg total) by mouth daily after supper. (Patient not taking: Reported on 11/12/2021) 90 capsule 4  ? diclofenac Sodium (VOLTAREN) 1 % GEL Apply 2 g topically 3 (three) times daily as needed. 350 g 1  ? polyethylene glycol (GOLYTELY) 236 g solution Take 4,000 mLs by mouth as directed. 4000 mL 0  ? ?No facility-administered medications prior to visit.  ? ? ?Allergies  ?Allergen Reactions  ? Codeine Itching  ? ? ?ROS ?Review of Systems  ?Constitutional:  Negative  for chills, diaphoresis, fatigue, fever and unexpected weight change.  ?HENT:  Negative for congestion, postnasal drip and rhinorrhea.   ?Eyes:  Negative for photophobia and visual disturbance.  ?Respiratory: Negative.    ?Cardiovascular: Negative.   ?Gastrointestinal: Negative.   ?Endocrine: Negative for polyphagia and polyuria.  ?Genitourinary:  Negative for difficulty urinating, frequency and urgency.  ?Musculoskeletal:  Negative for gait problem and joint swelling.  ?Neurological:  Positive for light-headedness. Negative for dizziness, facial asymmetry, speech difficulty, weakness and headaches.  ? ?  ? ?  11/12/2021  ?  8:33 AM 01/29/2021  ?  8:34 AM 12/11/2020  ?  8:09 AM  ?Depression screen PHQ 2/9  ?Decreased Interest 0 0 0  ?Down, Depressed, Hopeless 0 0 1  ?PHQ - 2 Score 0 0 1  ? ? ? ?Objective:  ?  ?Physical Exam ?Vitals and nursing note reviewed.  ?Constitutional:   ?   General: He is not in acute distress. ?   Appearance: Normal appearance. He is not ill-appearing, toxic-appearing or diaphoretic.  ?HENT:  ?   Head: Atraumatic.  ?   Right Ear: Tympanic membrane, ear canal and external ear normal.  ?   Left Ear: Tympanic membrane, ear canal and external ear normal.  ?   Mouth/Throat:  ?   Mouth: Mucous membranes are moist.  ?   Pharynx: Oropharynx is clear. No oropharyngeal exudate or posterior oropharyngeal erythema.  ?Eyes:  ?   General: No scleral icterus.    ?   Right eye: No discharge.     ?   Left eye: No discharge.  ?   Extraocular Movements: Extraocular movements intact.  ?   Conjunctiva/sclera: Conjunctivae normal.  ?   Pupils: Pupils are equal, round, and reactive to light.  ?Neck:  ?   Vascular: No carotid bruit.  ?Cardiovascular:  ?   Rate and Rhythm: Normal rate and regular rhythm.  ?Pulmonary:  ?   Effort: Pulmonary effort is normal.  ?   Breath sounds: Normal breath sounds.  ?Abdominal:  ?   General: Bowel sounds are normal.  ?Musculoskeletal:  ?   Cervical back: No rigidity or tenderness.   ?Lymphadenopathy:  ?   Cervical: No cervical adenopathy.  ?Skin: ?   General: Skin is warm and dry.  ?Neurological:  ?   Mental Status: He is  alert and oriented to person, place, and time.  ?Psychiatric:     ?   Mood and Affect: Mood normal.     ?   Behavior: Behavior normal.  ? ? ?BP (!) 122/94 (BP Location: Right Arm, Patient Position: Sitting, Cuff Size: Normal)   Pulse 80   Temp 97.6 ?F (36.4 ?C) (Temporal)   Ht _0  (1.702 m)   Wt 196 lb 9.6 oz (89.2 kg)   SpO2 95%   BMI 30.79 kg/m?  ?Wt Readings from Last 3 Encounters:  ?11/12/21 196 lb 9.6 oz (89.2 kg)  ?05/26/21 187 lb (84.8 kg)  ?03/27/21 182 lb (82.6 kg)  ? ? ? ?Health Maintenance Due  ?Topic Date Due  ? Hepatitis C Screening  Never done  ? ? ?There are no preventive care reminders to display for this patient. ? ?Lab Results  ?Component Value Date  ? TSH 1.71 05/06/2020  ? ?Lab Results  ?Component Value Date  ? WBC 4.5 01/29/2021  ? HGB 13.2 01/29/2021  ? HCT 39.3 01/29/2021  ? MCV 91 01/29/2021  ? PLT 286 01/29/2021  ? ?Lab Results  ?Component Value Date  ? NA 141 01/29/2021  ? K 4.2 01/29/2021  ? CO2 23 01/29/2021  ? GLUCOSE 92 01/29/2021  ? BUN 24 01/29/2021  ? CREATININE 1.08 01/29/2021  ? BILITOT 0.6 12/11/2020  ? ALKPHOS 42 (L) 12/11/2020  ? AST 15 12/11/2020  ? ALT 21 12/11/2020  ? PROT 6.0 12/11/2020  ? ALBUMIN 4.2 12/11/2020  ? CALCIUM 9.0 01/29/2021  ? EGFR 82 01/29/2021  ? GFR 75.20 05/06/2020  ? ?Lab Results  ?Component Value Date  ? CHOL 124 12/11/2020  ? ?Lab Results  ?Component Value Date  ? HDL 38 (L) 12/11/2020  ? ?Lab Results  ?Component Value Date  ? [place] 71 12/11/2020  ? ?Lab Results  ?Component Value Date  ? TRIG 77 12/11/2020  ? ?Lab Results  ?Component Value Date  ? CHOLHDL 3.3 12/11/2020  ? ?No results found for: HGBA1C ? ?  ?Assessment & Plan:  ? ?Problem List Items Addressed This Visit   ? ?  ? Cardiovascular and Mediastinum  ? Essential hypertension  ? Relevant Orders  ? Comprehensive metabolic panel  ? Urinalysis,  Routine w reflex microscopic  ?  ? Digestive  ? Gastroesophageal reflux disease  ? Relevant Medications  ? omeprazole (PRILOSEC) 40 MG capsule  ?  ? Genitourinary  ? Benign prostatic hyperplasia with urinary hesit

## 2021-11-22 ENCOUNTER — Other Ambulatory Visit: Payer: Self-pay | Admitting: Sports Medicine

## 2021-12-15 ENCOUNTER — Other Ambulatory Visit: Payer: Self-pay | Admitting: Sports Medicine

## 2021-12-16 MED ORDER — DICLOFENAC SODIUM 75 MG PO TBEC: 75.0000 mg | DELAYED_RELEASE_TABLET | Freq: Two times a day (BID) | ORAL | 0 refills | Status: DC | PRN

## 2021-12-22 ENCOUNTER — Ambulatory Visit: Payer: Managed Care, Other (non HMO) | Admitting: Sports Medicine

## 2021-12-22 VITALS — BP 134/90 | Ht 67.0 in | Wt 194.0 lb

## 2021-12-22 DIAGNOSIS — M79602 Pain in left arm: Secondary | ICD-10-CM | POA: Diagnosis not present

## 2021-12-22 DIAGNOSIS — M79601 Pain in right arm: Secondary | ICD-10-CM | POA: Diagnosis not present

## 2021-12-22 NOTE — Progress Notes (Signed)
? ?  Subjective:  ? ? Patient ID: Harold Morgan, male    DOB: [birth year], 56 y.o.   MRN: [id number] ? ?HPI chief complaint: Right arm pain ? ?Harold Morgan presents today with his wife to discuss right arm pain.  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  His current pain is different than the arthritis pain he has experienced previously.  For the past several months he has been experiencing intermittent debilitating sharp pain that will originate in the forearm and radiate up into his elbow.  He also endorses occasional numbness and tingling in the same area.  He does have similar symptoms in the left arm but not as severe as the right.  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  He denies any recent trauma. ? ?Intra medical history reviewed ?Medications reviewed ?Allergies reviewed ? ? ? ?Review of Systems ?As above ?   ?Objective:  ? Physical Exam ? ?Well-developed, well-nourished.  No acute distress ? ?Examination of his hands show full range of motion of all major joints.  No obvious effusions.  Examination of his wrists show full range of motion.  No tenderness to palpation and no effusion.  He does have some tenderness to palpation along the volar aspect of the right forearm but no palpable mass.  Neurological exam shows no atrophy.  Good strength in both upper extremities. ? ? ?   ?Assessment & Plan:  ? ?Bilateral arm pain, right greater than left ?History of left thigh sheath tumor ? ?Diagnosis is not straightforward.  Symptoms may be neuropathic in nature but he is also concerned about a possible sheath tumor in the right arm.  I think Harold Morgan would be best served by consultation with Dr. Tempie Donning to discuss proper work-up.  He and his wife are in agreement with that plan.  In the meantime, he will continue with his diclofenac.  He has been taking it twice  a day for several months and there is an order in the chart for a CBC and CMP.  I explained to both him and his wife the importance of following up on that blood work as ordered by his PCP.  They understand. ? ?This note was dictated using Dragon naturally speaking software and may contain errors in syntax, spelling, or content which have not been identified prior to signing this note.  ?

## 2021-12-24 IMAGING — DX DG ELBOW COMPLETE 3+V*R*
3 series · 3 of 3 positions shown · non-contrast
Comparison: None.

CLINICAL DATA: Right elbow pain and swelling for 5 days, no known
injury

EXAM:
RIGHT ELBOW - COMPLETE 3+ VIEW

[elbow ap]
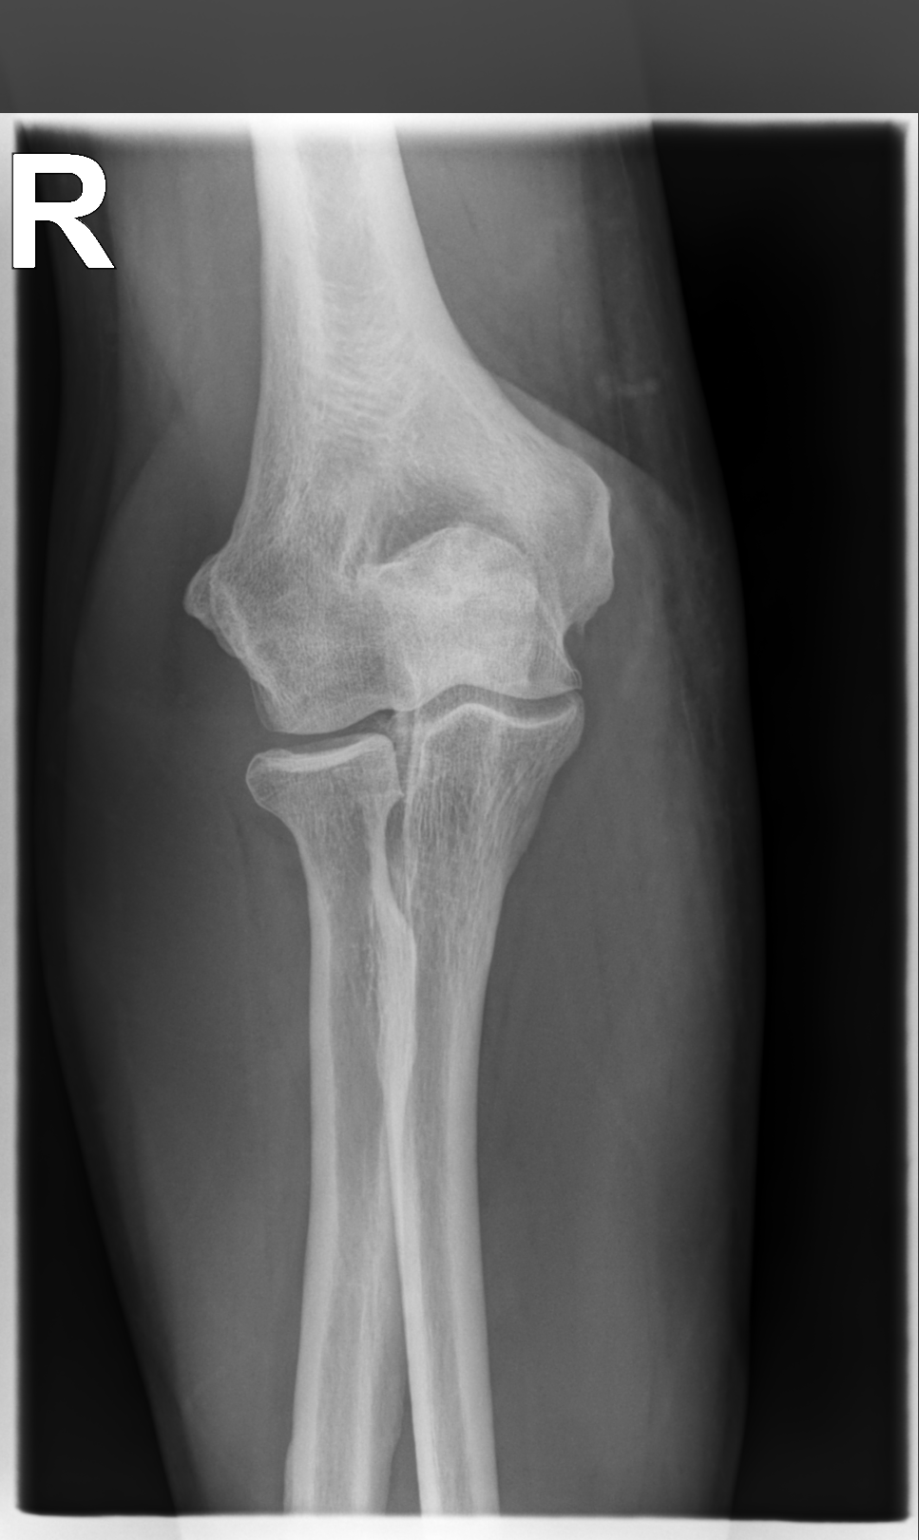

[elbow mlo]
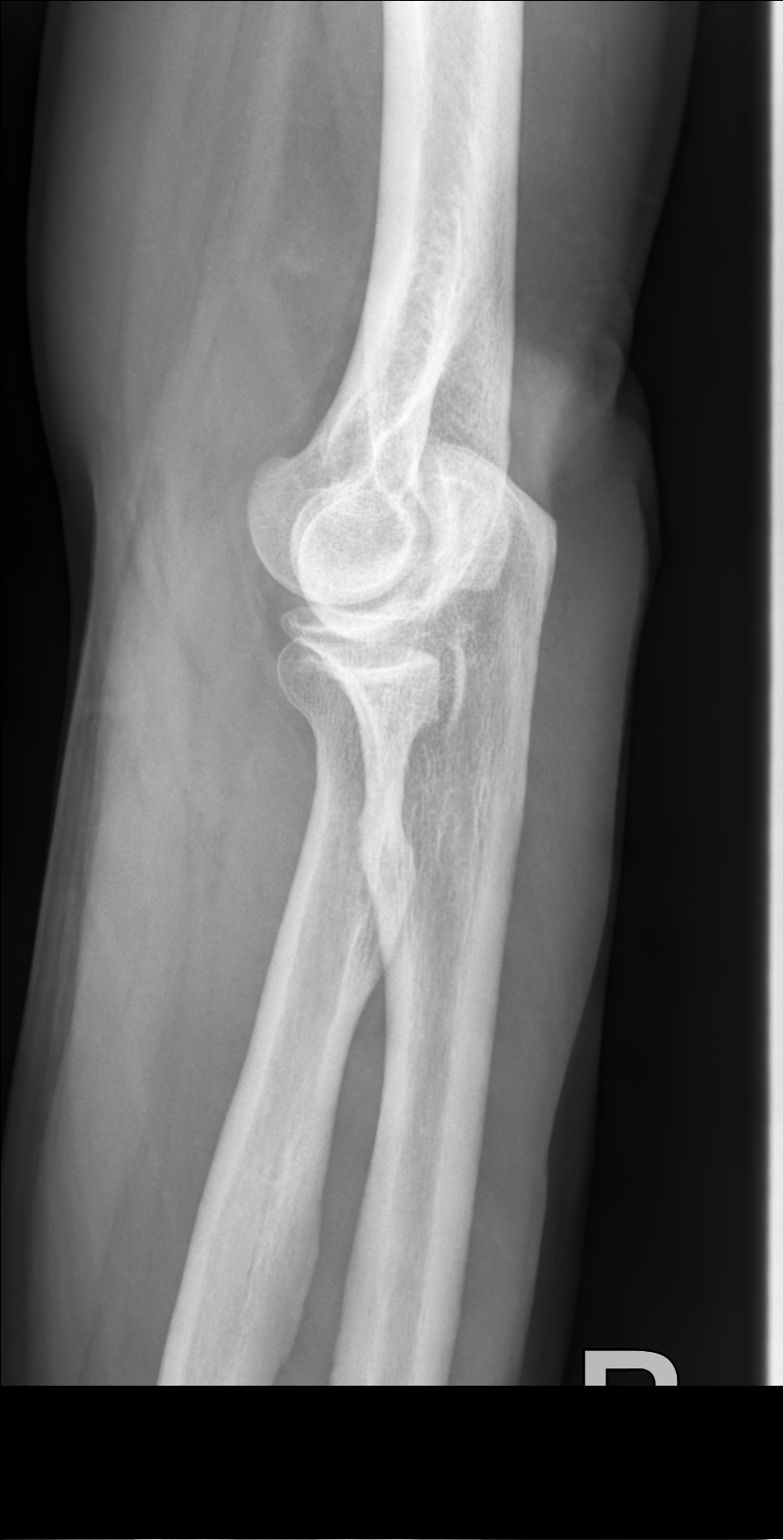

[elbow lat]
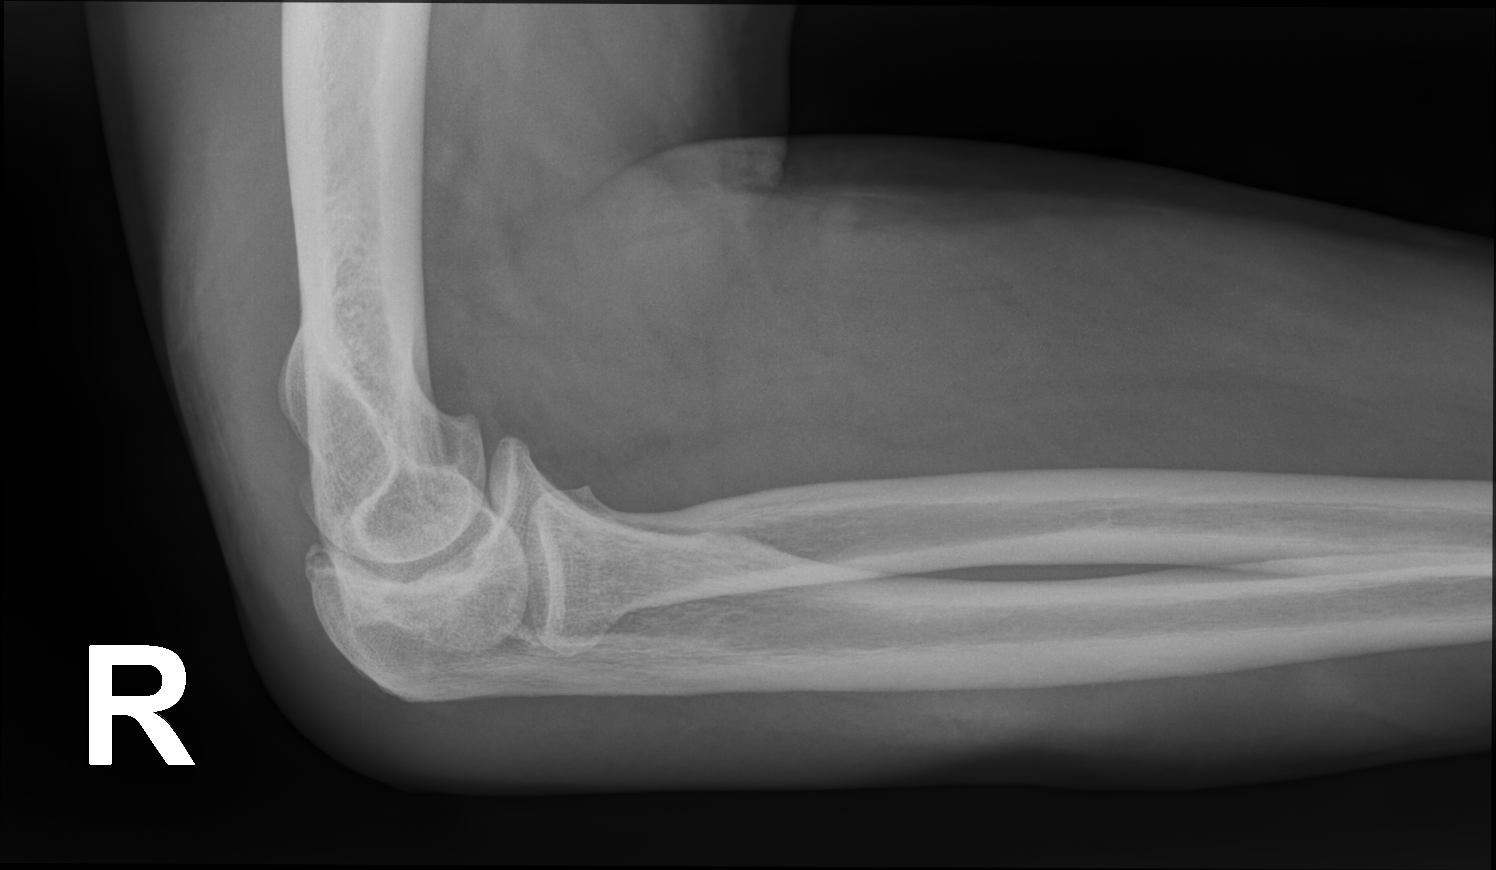

[3 of 3 positions shown; findings below may reference images not displayed]

FINDINGS: No fracture or dislocation of the right elbow. There is mild
humeroulnar arthrosis. No elbow joint effusion. Soft tissue edema
overlying the olecranon and posterior forearm.
IMPRESSION: 1. No fracture or dislocation of the right elbow. There is mild
humeroulnar arthrosis. No elbow joint effusion.
2. Soft tissue edema overlying the olecranon and posterior forearm.

## 2021-12-29 ENCOUNTER — Ambulatory Visit: Payer: Managed Care, Other (non HMO) | Admitting: Orthopedic Surgery

## 2021-12-29 ENCOUNTER — Ambulatory Visit: Payer: Self-pay

## 2021-12-29 ENCOUNTER — Ambulatory Visit (INDEPENDENT_AMBULATORY_CARE_PROVIDER_SITE_OTHER): Payer: Managed Care, Other (non HMO)

## 2021-12-29 DIAGNOSIS — M79641 Pain in right hand: Secondary | ICD-10-CM

## 2021-12-29 DIAGNOSIS — M79642 Pain in left hand: Secondary | ICD-10-CM

## 2021-12-29 DIAGNOSIS — M180 Bilateral primary osteoarthritis of first carpometacarpal joints: Secondary | ICD-10-CM

## 2021-12-29 DIAGNOSIS — R200 Anesthesia of skin: Secondary | ICD-10-CM

## 2021-12-29 HISTORY — DX: Anesthesia of skin: R20.0

## 2021-12-29 HISTORY — DX: Bilateral primary osteoarthritis of first carpometacarpal joints: M18.0

## 2021-12-29 NOTE — Progress Notes (Signed)
Office Visit Note   Patient: Harold Morgan           Date of Birth: 1966/06/21           MRN: [id number] Visit Date: 12/29/2021              Requested by: Thurman Coyer, DO [address] [place],  [place] [id number] PCP: Libby Maw, MD   Assessment & Plan: Visit Diagnoses:  1. Pain in both hands   2. Bilateral hand numbness   3. 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  He has osteoarthritis of both thumb CMC joints w/ involvement of the STT joints.  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  He does not have any palpable mass at the proximal forearm with no exam evidence of proximal median nerve entrapment, medial or lateral epicondylitis, or bicipital tendonitis.   He does have radiographic evidence of mild ulnohumeral osteoarthritis.  I would like to get an EMG/NCS to further evaluate his numbness and paresthesias.  We will give him some wrist braces to wear at night in the mean time.  We reviewed both conservative and surgical treatment options for thumb CMC arthritis.  He would like to continue oral NSAIDs for now but may opt for a corticosteroid injection at his next visit.  I can see him back after the EMG/NCS is completed.   Follow-Up Instructions: No follow-ups on file.   Orders:  Orders Placed This Encounter  Procedures   XR Elbow Complete Right (3+View)   XR Elbow Complete Left (3+View)   XR Hand Complete Left   XR Hand Complete Right   No orders of the defined types were placed in this encounter.     Procedures: No procedures performed   Clinical Data: No additional findings.   Subjective: Chief Complaint  Patient presents with   Left Hand - Pain   Right Hand - Pain    This is a 56 year old right-hand-dominant male who  presents with multiple complaints involving bilateral upper extremities.  He describes numbness and tingling in all the fingers of both of his hands.  His right seems to be more affected than the left.  This has been going on for a year or so.  He notes that he wakes from sleep multiple nights per week and has to shake his hands for symptom relief.  He also describes numbness during the day with certain activities such as driving or operating machinery.  He has never had any treatment for this issue.  He describes pain at the bases of both of his thumbs around the CMC/STT joints.  Again the right is more affected than the left.  This has been going on for a year and a half or 2 years.  His pain is worse with tight gripping activities such as turning a wrench.  He describes a sharp stabbing pain in the volar aspect of the right forearm that is seemingly random but quite bothersome for him.  He is taking oral diclofenac for bilateral shoulder and elbow pain.  This seems to be working reasonably well for him.   Review of Systems   Objective: Vital Signs: There were no vitals taken for this visit.  Physical Exam Constitutional:  Appearance: Normal appearance.  Cardiovascular:     Rate and Rhythm: Normal rate.     Pulses: Normal pulses.  Pulmonary:     Effort: Pulmonary effort is normal.  Skin:    General: Skin is warm and dry.     Capillary Refill: Capillary refill takes less than 2 seconds.  Neurological:     Mental Status: He is alert.    Right Hand Exam   Tenderness  Right hand tenderness location: TTP at [facility] joint w/ mild to moderate swelling.  Range of Motion  The patient has normal right wrist ROM.   Other  Erythema: absent Sensation: normal Pulse: present  Comments:  Pain and crepitus with CMC grind test.  Positive Tinel, Phalen, and Durkan signs.  Positive Tinel sign at elbow w/out evidence of ulnar nerve subluxation or instability. No pain w/ palpation of volar proximal  forearm and no palpable masses.  No pain or sensory changes w/ resisted forearm pronation with elbow extended or with resisted elbow flexion w/ forearm supinated. No pain w/ resisted wrist or middle finger extension.    Left Hand Exam   Tenderness  Left hand tenderness location: TTP at [facility] joint w/ mild to moderate swelling.   Range of Motion  The patient has normal left wrist ROM.  Other  Erythema: absent Sensation: normal Pulse: present  Comments:  Pain and crepitus with CMC grind test.  Positive Tinel, Phalen, and Durkan signs.  Positive Tinel sign at elbow w/out evidence of ulnar nerve subluxation or instability.      Specialty Comments:  No specialty comments available.  Imaging: No results found.   PMFS History: Patient Active Problem List   Diagnosis Date Noted   Bilateral hand numbness 12/29/2021   Arthritis of carpometacarpal (CMC) joints of both thumbs 12/29/2021   Snores 11/12/2021   B12 deficiency 01/30/2021   Gastroesophageal reflux disease 01/30/2021   Healthcare maintenance 01/30/2021   Pain and swelling of right elbow 01/29/2021   Anemia 01/29/2021   Other microscopic hematuria 01/29/2021   Depression, major, single episode, moderate (Colon) 12/11/2020   Benign prostatic hyperplasia with urinary hesitancy 12/11/2020   Urinary frequency 07/07/2020   Right testicular pain 07/07/2020   Essential hypertension 05/06/2020   Arthritis of both hands 02/27/2020   Elevated LDL cholesterol level 02/27/2020   Lower respiratory infection 02/27/2020   Left leg pain 10/24/2017   Nonintractable episodic headache 10/17/2017   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   Past Medical History:  Diagnosis Date   Anxiety    on meds   Arthritis    generalized   Depression    on meds   GERD (gastroesophageal reflux disease)    PRN meds- with certain foods   Heartburn    HLD (hyperlipidemia)    on meds   HTN (hypertension)    on meds   Kidney stone     Family History   Problem Relation Age of Onset   Emphysema Father    Cancer Sister        lung caner, tobacco use   Aneurysm Maternal Uncle 39       brain   Bladder Cancer Neg Hx    Prostate cancer Neg Hx    Colon polyps Neg Hx    Colon cancer Neg Hx    Esophageal cancer Neg Hx    Rectal cancer Neg Hx     Past Surgical History:  Procedure Laterality Date   LITHOTRIPSY     TONSILLECTOMY  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

## 2022-01-18 ENCOUNTER — Other Ambulatory Visit: Payer: Self-pay | Admitting: Sports Medicine

## 2022-01-29 ENCOUNTER — Encounter: Payer: Managed Care, Other (non HMO) | Admitting: Physical Medicine and Rehabilitation

## 2022-01-31 ENCOUNTER — Encounter: Payer: Self-pay | Admitting: Family Medicine

## 2022-02-01 ENCOUNTER — Other Ambulatory Visit: Payer: Self-pay

## 2022-02-01 DIAGNOSIS — E7800 Pure hypercholesterolemia, unspecified: Secondary | ICD-10-CM

## 2022-02-01 MED ORDER — ROSUVASTATIN CALCIUM 40 MG PO TABS: 40.0000 mg | ORAL_TABLET | Freq: Every day | ORAL | 0 refills | Status: DC

## 2022-02-18 ENCOUNTER — Other Ambulatory Visit: Payer: Self-pay | Admitting: Sports Medicine

## 2022-02-24 ENCOUNTER — Other Ambulatory Visit: Payer: Self-pay | Admitting: Family Medicine

## 2022-02-24 DIAGNOSIS — F321 Major depressive disorder, single episode, moderate: Secondary | ICD-10-CM

## 2022-02-28 ENCOUNTER — Other Ambulatory Visit: Payer: Self-pay | Admitting: Family Medicine

## 2022-02-28 DIAGNOSIS — E7800 Pure hypercholesterolemia, unspecified: Secondary | ICD-10-CM

## 2022-03-10 ENCOUNTER — Other Ambulatory Visit (INDEPENDENT_AMBULATORY_CARE_PROVIDER_SITE_OTHER): Payer: Managed Care, Other (non HMO)

## 2022-03-10 DIAGNOSIS — D649 Anemia, unspecified: Secondary | ICD-10-CM | POA: Diagnosis not present

## 2022-03-10 DIAGNOSIS — R42 Dizziness and giddiness: Secondary | ICD-10-CM

## 2022-03-10 DIAGNOSIS — E7800 Pure hypercholesterolemia, unspecified: Secondary | ICD-10-CM

## 2022-03-10 DIAGNOSIS — I10 Essential (primary) hypertension: Secondary | ICD-10-CM

## 2022-03-10 DIAGNOSIS — N401 Enlarged prostate with lower urinary tract symptoms: Secondary | ICD-10-CM

## 2022-03-10 DIAGNOSIS — E538 Deficiency of other specified B group vitamins: Secondary | ICD-10-CM

## 2022-03-10 NOTE — Addendum Note (Signed)
Addended by: Lynnea Ferrier on: 03/10/2022 07:59 AM   Modules accepted: Orders

## 2022-03-11 LAB — CBC
Hematocrit: 40.1 % (ref 37.5–51.0)
Hemoglobin: 13.5 g/dL (ref 13.0–17.7)
MCH: 31.8 pg (ref 26.6–33.0)
MCHC: 33.7 g/dL (ref 31.5–35.7)
MCV: 94 fL (ref 79–97)
Platelets: 275 10*3/uL (ref 150–450)
RBC: 4.25 x10E6/uL (ref 4.14–5.80)
RDW: 12.3 % (ref 11.6–15.4)
WBC: 5.3 10*3/uL (ref 3.4–10.8)

## 2022-03-11 LAB — COMPREHENSIVE METABOLIC PANEL
ALT: 35 IU/L (ref 0–44)
AST: 25 IU/L (ref 0–40)
Albumin/Globulin Ratio: 1.9 (ref 1.2–2.2)
Albumin: 4.1 g/dL (ref 3.8–4.9)
Alkaline Phosphatase: 37 IU/L — ABNORMAL LOW (ref 44–121)
BUN/Creatinine Ratio: 16 (ref 9–20)
BUN: 17 mg/dL (ref 6–24)
Bilirubin Total: 0.4 mg/dL (ref 0.0–1.2)
CO2: 21 mmol/L (ref 20–29)
Calcium: 8.8 mg/dL (ref 8.7–10.2)
Chloride: 106 mmol/L (ref 96–106)
Creatinine, Ser: 1.07 mg/dL (ref 0.76–1.27)
Globulin, Total: 2.2 g/dL (ref 1.5–4.5)
Glucose: 104 mg/dL — ABNORMAL HIGH (ref 70–99)
Potassium: 3.8 mmol/L (ref 3.5–5.2)
Sodium: 142 mmol/L (ref 134–144)
Total Protein: 6.3 g/dL (ref 6.0–8.5)
eGFR: 81 mL/min/{1.73_m2} (ref >59)

## 2022-03-11 LAB — LDL CHOLESTEROL, DIRECT: LDL Direct: 99 mg/dL (ref 0–99)

## 2022-03-11 LAB — VITAMIN B12: Vitamin B-12: 1235 pg/mL (ref 232–1245)

## 2022-03-11 LAB — LIPID PANEL
Chol/HDL Ratio: 4.1 ratio (ref 0.0–5.0)
Cholesterol, Total: 156 mg/dL (ref 100–199)
HDL: 38 mg/dL — ABNORMAL LOW (ref >39)
LDL Chol Calc (NIH): 97 mg/dL (ref 0–99)
Triglycerides: 117 mg/dL (ref 0–149)
VLDL Cholesterol Cal: 21 mg/dL (ref 5–40)

## 2022-03-11 LAB — PSA: Prostate Specific Ag, Serum: 0.3 ng/mL (ref 0.0–4.0)

## 2022-03-11 LAB — URINALYSIS, ROUTINE W REFLEX MICROSCOPIC

## 2022-03-18 ENCOUNTER — Other Ambulatory Visit: Payer: Self-pay | Admitting: Family Medicine

## 2022-03-18 DIAGNOSIS — E538 Deficiency of other specified B group vitamins: Secondary | ICD-10-CM

## 2022-03-28 ENCOUNTER — Other Ambulatory Visit: Payer: Self-pay | Admitting: Family Medicine

## 2022-03-28 DIAGNOSIS — I10 Essential (primary) hypertension: Secondary | ICD-10-CM

## 2022-03-29 ENCOUNTER — Other Ambulatory Visit: Payer: Self-pay | Admitting: Sports Medicine

## 2022-04-19 ENCOUNTER — Telehealth: Payer: Self-pay | Admitting: Physical Medicine and Rehabilitation

## 2022-04-19 NOTE — Telephone Encounter (Signed)
Patient's wife Earnest Bailey called advised patient need to schedule an appointment with Dr. Ernestina Patches for patient hands. Earnest Bailey said the pain is moving up patient's arms. Earnest Bailey said patient has been in pain for 2 weeks. The number to contact Randall is [phone]

## 2022-04-20 NOTE — Telephone Encounter (Signed)
Holding to discuss with MD

## 2022-04-21 NOTE — Telephone Encounter (Signed)
IC-actually wants to get [facility] injections that were discussed but he declined. I have scheduled him to see you for bilat hand CMC injections. If this is not ok please let me know.

## 2022-05-04 ENCOUNTER — Ambulatory Visit: Payer: Managed Care, Other (non HMO) | Admitting: Sports Medicine

## 2022-05-04 ENCOUNTER — Encounter: Payer: Self-pay | Admitting: Sports Medicine

## 2022-05-04 VITALS — BP 142/76 | HR 82 | Ht 67.0 in | Wt 195.0 lb

## 2022-05-04 DIAGNOSIS — M79642 Pain in left hand: Secondary | ICD-10-CM | POA: Diagnosis not present

## 2022-05-04 DIAGNOSIS — M180 Bilateral primary osteoarthritis of first carpometacarpal joints: Secondary | ICD-10-CM | POA: Diagnosis not present

## 2022-05-04 DIAGNOSIS — M79641 Pain in right hand: Secondary | ICD-10-CM | POA: Diagnosis not present

## 2022-05-04 DIAGNOSIS — R200 Anesthesia of skin: Secondary | ICD-10-CM | POA: Diagnosis not present

## 2022-05-04 MED ORDER — METHYLPREDNISOLONE 4 MG PO TBPK: ORAL_TABLET | ORAL | 0 refills | Status: DC

## 2022-05-04 NOTE — Progress Notes (Signed)
Bilateral hand pain; Saw Dr. Tempie Donning and he mentioned injections.  Pain is severe enough that it wakes him at night. Sharp shooting pains. Does complain of swelling in the hands.  Diclofenac for the pain; does help some.

## 2022-05-04 NOTE — Progress Notes (Signed)
Harold Morgan - 56 y.o. male MRN [id number]  Date of birth: 10-01-1965  Office Visit Note: Visit Date: 05/04/2022 PCP: Libby Maw, MD Referred by: Libby Maw,*  Subjective: CC: bilateral hand pain and numbness   HPI: Harold Morgan is a pleasant 56 y.o. male who presents today for bilateral hand pain and numbness.  Presents with his wife today who does help with some of HPI.  Harold Morgan continues with bilateral hand pain and numbness.  Harold Morgan had seen Dr. Tempie Donning previously and had x-rays of both the hands and the elbows which showed some mild OA throughout, as well as moderate CMC and STT arthritic change.  Harold Morgan is on diclofenac once daily, then as needed and this does help improve his pain to some extent.  Harold Morgan reports both pain in the thumb but also in the wrist.  Harold Morgan also reports a shooting and tingling sensation in all 5 fingers of both the right and the left hand.  Harold Morgan does state the right hand is worse than the left.  Sometimes will get occasional swelling.  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  This will exacerbate his pain.   Denies any personal or family history of rheumatoid arthritis, lupus, inflammatory conditions.  Pertinent ROS were reviewed with the patient and found to be negative unless otherwise specified above in HPI.   Assessment & Plan: Visit Diagnoses:  1. Arthritis of carpometacarpal (CMC) joints of both thumbs   2. Bilateral hand numbness   3. Pain in both hands    Plan: Harold Morgan certainly has [facility] joint arthritis that is reproducible on exam with CMC grind test today.  However, Harold Morgan is having bilateral hand pain and numbness that seems more so in the median nerve root distribution, but reporting numbness and tingling in the fourth and fifth digit as well with possible ? Cubital tunnel involvement.  At this point given the ongoing of symptoms and unusual dermatomal pattern, I  think it is best we proceed with EMG/NCS studies of bilateral upper extremities with Dr. Ernestina Patches when able.  We did discuss injection therapy for the [facility] joint, although given that his pain is rather diffuse, decided to do a methylprednisolone steroid taper for 6 days to help control his pain.  Harold Morgan will pay attention and let me know how much and where his pain relief is while completing this course.  We will follow-up after nerve conduction studies for reevaluation.  Follow-up: Return for Schedule EMS/NCS with Dr. Ernestina Patches; follow-up with Dr. Rolena Infante a few days to week after this.   Meds & Orders:  Meds ordered this encounter  Medications   methylPREDNISolone (MEDROL DOSEPAK) 4 MG TBPK tablet    Sig: Take as directed on packet (taper down each day) --> 6 tabs, 5 tabs, 4 tabs...    Dispense:  1 each    Refill:  0    Orders Placed This Encounter  Procedures   Ambulatory referral to Physical Medicine Rehab     Procedures: No procedures performed      Clinical History: No specialty comments available.  Harold Morgan reports that Harold Morgan has never smoked. Harold Morgan has quit using smokeless tobacco.  His smokeless tobacco use included chew. No results for input(s): "HGBA1C", "LABURIC" in the last 8760 hours.  Objective:   Vital Signs: BP (!) 142/76   Pulse 82   Ht '5\' 7"'$  (1.702 m)   Wt 195 lb (  88.5 kg)   BMI 30.54 kg/m   Physical Exam  Gen: Well-appearing, in no acute distress; non-toxic CV: Regular Rate. Well-perfused. Warm.  Resp: Breathing unlabored on room air; no wheezing. Psych: Fluid speech in conversation; appropriate affect; normal thought process Neuro: Sensation intact throughout. No gross coordination deficits.   Ortho Exam - Bilateral hands: No gross swelling or effusion of the hands or wrist.  There is both pain and crepitus with positive CMC grind test bilaterally, right greater than left.  Positive Tinel's test at the carpal tunnel bilaterally, mildly positive Tinel's sign at the elbow without  instability.  Phalen's and reverse Phalen's does cause some pain as well as reproduction of numbness tingling.  There is full range of motion with flexion extension at the wrist and the elbows.  Neurovascular intact distally with palpation of the radial artery.  Imaging: XR Hand Complete Right Multiple views of the right hand taken today reviewed interpreted by me.   Demonstrate joint space narrowing at the thumb [facility] joint with osteophyte  formation and subchondral sclerosis.  There is significant involvement of  the scaphotrapezial articulation with near complete loss of joint space  and subchondral sclerosis. XR Hand Complete Left Multiple views of the left hand taken today are reviewed interpreted by  me.  They demonstrate osteophyte formation and joint space narrowing at  the thumb [facility] joint consistent with osteoarthritis.  There are very mild  degenerative changes present at the STT joint with overall well-maintained  joint spaces. XR Elbow Complete Left (3+View) Multiple views the right elbow taken today are reviewed interpreted by me.   They demonstrate well-maintained joint spaces but some mild osteophyte  formation at the ulnohumeral and radiocapitellar articulations.  There is  no evidence of subluxation or instability.  There are no acute bony  injuries present. XR Elbow Complete Right (3+View) Multiple views the right elbow taken today are reviewed interpreted by me.   They demonstrate well-maintained joint spaces but some mild osteophyte  formation at the ulnohumeral and radiocapitellar articulations.  There is  no evidence of subluxation or instability.  There are no acute bony  injuries present.    Past Medical/Family/Surgical/Social History: Medications & Allergies reviewed per EMR, new medications updated. Patient Active Problem List   Diagnosis Date Noted   Bilateral hand numbness 12/29/2021   Arthritis of carpometacarpal (CMC) joints of both thumbs 12/29/2021    Snores 11/12/2021   B12 deficiency 01/30/2021   Gastroesophageal reflux disease 01/30/2021   Healthcare maintenance 01/30/2021   Pain and swelling of right elbow 01/29/2021   Anemia 01/29/2021   Other microscopic hematuria 01/29/2021   Depression, major, single episode, moderate ([place]) 12/11/2020   Benign prostatic hyperplasia with urinary hesitancy 12/11/2020   Urinary frequency 07/07/2020   Right testicular pain 07/07/2020   Essential hypertension 05/06/2020   Arthritis of both hands 02/27/2020   Elevated LDL cholesterol level 02/27/2020   Lower respiratory infection 02/27/2020   Left leg pain 10/24/2017   Nonintractable episodic headache 10/17/2017   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   Past Medical History:  Diagnosis Date   Anxiety    on meds   Arthritis    generalized   Depression    on meds   GERD (gastroesophageal reflux disease)    PRN meds- with certain foods   Heartburn    HLD (hyperlipidemia)    on meds   HTN (hypertension)    on meds   Kidney stone    Family History  Problem Relation  Age of Onset   Emphysema Father    Cancer Sister        lung caner, tobacco use   Aneurysm Maternal Uncle 54       brain   Bladder Cancer Neg Hx    Prostate cancer Neg Hx    Colon polyps Neg Hx    Colon cancer Neg Hx    Esophageal cancer Neg Hx    Rectal cancer Neg Hx    Past Surgical History:  Procedure Laterality Date   LITHOTRIPSY     TONSILLECTOMY     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

## 2022-05-04 NOTE — Patient Instructions (Signed)
-   Trial of prednisone Dosepak, take as directed for 6 days -We can try the [facility] cool comfort splints -Please get scheduled for the EMG/nerve conduction study with Dr. Ernestina Patches, we will follow-up a few days to a week after this with myself to discuss next steps

## 2022-05-09 ENCOUNTER — Other Ambulatory Visit: Payer: Self-pay | Admitting: Family Medicine

## 2022-05-09 DIAGNOSIS — E7800 Pure hypercholesterolemia, unspecified: Secondary | ICD-10-CM

## 2022-05-12 MED ORDER — ROSUVASTATIN CALCIUM 40 MG PO TABS: 40.0000 mg | ORAL_TABLET | Freq: Every day | ORAL | 0 refills | Status: DC

## 2022-05-19 ENCOUNTER — Encounter: Payer: Managed Care, Other (non HMO) | Admitting: Physical Medicine and Rehabilitation

## 2022-05-19 ENCOUNTER — Telehealth: Payer: Self-pay | Admitting: Physical Medicine and Rehabilitation

## 2022-05-19 ENCOUNTER — Other Ambulatory Visit: Payer: Self-pay | Admitting: Sports Medicine

## 2022-05-19 NOTE — Telephone Encounter (Signed)
rescheduled

## 2022-05-19 NOTE — Telephone Encounter (Signed)
Patient called. Need to [facility] appointment. Not feeling well.

## 2022-05-21 ENCOUNTER — Encounter: Payer: Self-pay | Admitting: Family Medicine

## 2022-05-24 ENCOUNTER — Other Ambulatory Visit: Payer: Self-pay

## 2022-05-24 DIAGNOSIS — F321 Major depressive disorder, single episode, moderate: Secondary | ICD-10-CM

## 2022-05-24 MED ORDER — PAROXETINE HCL 20 MG PO TABS: 20.0000 mg | ORAL_TABLET | Freq: Every day | ORAL | 1 refills | Status: DC

## 2022-05-26 ENCOUNTER — Ambulatory Visit (INDEPENDENT_AMBULATORY_CARE_PROVIDER_SITE_OTHER): Payer: Managed Care, Other (non HMO) | Admitting: Physical Medicine and Rehabilitation

## 2022-05-26 DIAGNOSIS — R202 Paresthesia of skin: Secondary | ICD-10-CM

## 2022-05-26 NOTE — Progress Notes (Signed)
Numeric Pain Rating Scale and Functional Assessment Average Pain 5   In the last MONTH (on 0-10 scale) has pain interfered with the following?  1. General activity like being  able to carry out your everyday physical activities such as walking, climbing stairs, carrying groceries, or moving a chair?  Rating(10)     Right handed. Pain in both hands and wrist and pain can go up the right arm. Numbness and tingling in both hands

## 2022-05-28 ENCOUNTER — Other Ambulatory Visit: Payer: Self-pay | Admitting: *Deleted

## 2022-05-28 MED ORDER — DICLOFENAC SODIUM 75 MG PO TBEC
75.0000 mg | DELAYED_RELEASE_TABLET | Freq: Two times a day (BID) | ORAL | 0 refills | Status: DC | PRN
Start: 2022-05-28 — End: 2022-07-22

## 2022-05-31 NOTE — Procedures (Signed)
EMG & NCV Findings: Evaluation of the left median motor and the right median motor nerves showed prolonged distal onset latency (L4.7, R5.2 ms) and decreased conduction velocity (Elbow-Wrist, L49, R47 m/s).  The left median (across palm) sensory and the right median (across palm) sensory nerves showed prolonged distal peak latency (Wrist, L4.1, R5.1 ms) and prolonged distal peak latency (Palm, L2.2, R4.4 ms).  All remaining nerves (as indicated in the following tables) were within normal limits.  Left vs. Right side comparison data for the ulnar motor nerve indicates abnormal L-R velocity difference (A Elbow-B Elbow, 26 m/s).  All remaining left vs. right side differences were within normal limits.    All examined muscles (as indicated in the following table) showed no evidence of electrical instability.    Impression: The above electrodiagnostic study is ABNORMAL and reveals evidence of a moderate bilateral median nerve entrapment at the wrist (carpal tunnel syndrome) affecting sensory and motor components.   There is no significant electrodiagnostic evidence of any other focal nerve entrapment, brachial plexopathy or cervical radiculopathy in either upper limb.   Recommendations: 1.  Follow-up with referring physician. 2.  Continue current management of symptoms. 3.  Suggest surgical evaluation.  ___________________________ Laurence Spates FAAPMR Board Certified, American Board of Physical Medicine and Rehabilitation    Nerve Conduction Studies Anti Sensory Summary Table   Stim Site NR Peak (ms) Norm Peak (ms) P-T Amp (V) Norm P-T Amp Site1 Site2 Delta-P (ms) Dist (cm) Vel (m/s) Norm Vel (m/s)  Left Median Acr Palm Anti Sensory (2nd Digit)  32C  Wrist    *4.1 <3.6 22.2 >10 Wrist Palm 1.9 0.0    Palm    *2.2 <2.0 12.9         Right Median Acr Palm Anti Sensory (2nd Digit)  31.8C  Wrist    *5.1 <3.6 13.8 >10 Wrist Palm 0.7 0.0    Palm    *4.4 <2.0 2.1         Left Radial Anti Sensory  (Base 1st Digit)  31.9C  Wrist    2.1 <3.1 34.6  Wrist Base 1st Digit 2.1 0.0    Right Radial Anti Sensory (Base 1st Digit)  31.6C  Wrist    2.1 <3.1 32.0  Wrist Base 1st Digit 2.1 0.0    Left Ulnar Anti Sensory (5th Digit)  32.4C  Wrist    3.5 <3.7 25.9 >15.0 Wrist 5th Digit 3.5 14.0 40 >38  Right Ulnar Anti Sensory (5th Digit)  32.1C  Wrist    3.4 <3.7 19.5 >15.0 Wrist 5th Digit 3.4 14.0 41 >38   Motor Summary Table   Stim Site NR Onset (ms) Norm Onset (ms) O-P Amp (mV) Norm O-P Amp Site1 Site2 Delta-0 (ms) Dist (cm) Vel (m/s) Norm Vel (m/s)  Left Median Motor (Abd Poll Brev)  32.3C  Wrist    *4.7 <4.2 5.7 >5 Elbow Wrist 4.3 21.0 *49 >50  Elbow    9.0  5.5         Right Median Motor (Abd Poll Brev)  31.8C  Wrist    *5.2 <4.2 5.9 >5 Elbow Wrist 4.3 20.0 *47 >50  Elbow    9.5  5.7         Left Ulnar Motor (Abd Dig Min)  32.4C  Wrist    2.7 <4.2 7.1 >3 B Elbow Wrist 3.3 20.0 61 >53  B Elbow    6.0  6.4  A Elbow B Elbow 1.7 10.0 59 >53  A Elbow  7.7  6.2         Right Ulnar Motor (Abd Dig Min)  32C  Wrist    2.8 <4.2 8.0 >3 B Elbow Wrist 3.1 19.5 63 >53  B Elbow    5.9  7.4  A Elbow B Elbow 1.3 11.0 85 >53  A Elbow    7.2  7.0          EMG   Side Muscle Nerve Root Ins Act Fibs Psw Amp Dur Poly Recrt Int Fraser Din Comment  Right Abd Poll Brev Median C8-T1 Nml Nml Nml Nml Nml 0 Nml Nml   Right 1stDorInt Ulnar C8-T1 Nml Nml Nml Nml Nml 0 Nml Nml   Right PronatorTeres Median C6-7 Nml Nml Nml Nml Nml 0 Nml Nml   Right Biceps Musculocut C5-6 Nml Nml Nml Nml Nml 0 Nml Nml   Right Deltoid Axillary C5-6 Nml Nml Nml Nml Nml 0 Nml Nml     Nerve Conduction Studies Anti Sensory Left/Right Comparison   Stim Site L Lat (ms) R Lat (ms) L-R Lat (ms) L Amp (V) R Amp (V) L-R Amp (%) Site1 Site2 L Vel (m/s) R Vel (m/s) L-R Vel (m/s)  Median Acr Palm Anti Sensory (2nd Digit)  32C  Wrist *4.1 *5.1 1.0 22.2 13.8 37.8 Wrist Palm     Palm *2.2 *4.4 2.2 12.9 2.1 83.7       Radial Anti Sensory  (Base 1st Digit)  31.9C  Wrist 2.1 2.1 0.0 34.6 32.0 7.5 Wrist Base 1st Digit     Ulnar Anti Sensory (5th Digit)  32.4C  Wrist 3.5 3.4 0.1 25.9 19.5 24.7 Wrist 5th Digit 40 41 1   Motor Left/Right Comparison   Stim Site L Lat (ms) R Lat (ms) L-R Lat (ms) L Amp (mV) R Amp (mV) L-R Amp (%) Site1 Site2 L Vel (m/s) R Vel (m/s) L-R Vel (m/s)  Median Motor (Abd Poll Brev)  32.3C  Wrist *4.7 *5.2 0.5 5.7 5.9 3.4 Elbow Wrist *49 *47 2  Elbow 9.0 9.5 0.5 5.5 5.7 3.5       Ulnar Motor (Abd Dig Min)  32.4C  Wrist 2.7 2.8 0.1 7.1 8.0 11.3 B Elbow Wrist 61 63 2  B Elbow 6.0 5.9 0.1 6.4 7.4 13.5 A Elbow B Elbow 59 85 *26  A Elbow 7.7 7.2 0.5 6.2 7.0 11.4          Waveforms:

## 2022-06-04 ENCOUNTER — Ambulatory Visit: Payer: Managed Care, Other (non HMO) | Admitting: Sports Medicine

## 2022-06-06 NOTE — Progress Notes (Signed)
Harold Morgan - 56 y.o. male MRN [id number]  Date of birth: 27-Jan-1966  Office Visit Note: Visit Date: 05/26/2022 PCP: Libby Maw, MD Referred by: Elba Barman, DO  Subjective: Chief Complaint  Patient presents with   Right Wrist - Pain   Left Wrist - Pain   HPI:  Harold Morgan is a 56 y.o. male who comes in today at the request of Dr. Elba Barman for electrodiagnostic study of the Bilateral upper extremities.  Patient is Right hand dominant.  He reports chronic severe pain numbness and tingling in both hands.  Symptoms can go up the right arm.  Somewhat more radial digits but somewhat globally.  No history of prior electrodiagnostic studies.  No radicular complaints.   ROS Otherwise per HPI.  Assessment & Plan: Visit Diagnoses:    ICD-10-CM   1. Paresthesia of skin  R20.2 NCV with EMG (electromyography)      Plan: Impression: The above electrodiagnostic study is ABNORMAL and reveals evidence of a moderate bilateral median nerve entrapment at the wrist (carpal tunnel syndrome) affecting sensory and motor components.   There is no significant electrodiagnostic evidence of any other focal nerve entrapment, brachial plexopathy or cervical radiculopathy in either upper limb.   Recommendations: 1.  Follow-up with referring physician. 2.  Continue current management of symptoms. 3.  Suggest surgical evaluation.   Meds & Orders: No orders of the defined types were placed in this encounter.   Orders Placed This Encounter  Procedures   NCV with EMG (electromyography)    Follow-up: Return in about 2 weeks (around 06/09/2022) for Elba Barman, MD.   Procedures: No procedures performed  EMG & NCV Findings: Evaluation of the left median motor and the right median motor nerves showed prolonged distal onset latency (L4.7, R5.2 ms) and decreased conduction velocity (Elbow-Wrist, L49, R47 m/s).  The left median (across palm) sensory and the right median (across palm)  sensory nerves showed prolonged distal peak latency (Wrist, L4.1, R5.1 ms) and prolonged distal peak latency (Palm, L2.2, R4.4 ms).  All remaining nerves (as indicated in the following tables) were within normal limits.  Left vs. Right side comparison data for the ulnar motor nerve indicates abnormal L-R velocity difference (A Elbow-B Elbow, 26 m/s).  All remaining left vs. right side differences were within normal limits.    All examined muscles (as indicated in the following table) showed no evidence of electrical instability.    Impression: The above electrodiagnostic study is ABNORMAL and reveals evidence of a moderate bilateral median nerve entrapment at the wrist (carpal tunnel syndrome) affecting sensory and motor components.   There is no significant electrodiagnostic evidence of any other focal nerve entrapment, brachial plexopathy or cervical radiculopathy in either upper limb.   Recommendations: 1.  Follow-up with referring physician. 2.  Continue current management of symptoms. 3.  Suggest surgical evaluation.  ___________________________ Laurence Spates FAAPMR Board Certified, American Board of Physical Medicine and Rehabilitation    Nerve Conduction Studies Anti Sensory Summary Table   Stim Site NR Peak (ms) Norm Peak (ms) P-T Amp (V) Norm P-T Amp Site1 Site2 Delta-P (ms) Dist (cm) Vel (m/s) Norm Vel (m/s)  Left Median Acr Palm Anti Sensory (2nd Digit)  32C  Wrist    *4.1 <3.6 22.2 >10 Wrist Palm 1.9 0.0    Palm    *2.2 <2.0 12.9         Right Median Acr Palm Anti Sensory (2nd Digit)  31.8C  Wrist    *  5.1 <3.6 13.8 >10 Wrist Palm 0.7 0.0    Palm    *4.4 <2.0 2.1         Left Radial Anti Sensory (Base 1st Digit)  31.9C  Wrist    2.1 <3.1 34.6  Wrist Base 1st Digit 2.1 0.0    Right Radial Anti Sensory (Base 1st Digit)  31.6C  Wrist    2.1 <3.1 32.0  Wrist Base 1st Digit 2.1 0.0    Left Ulnar Anti Sensory (5th Digit)  32.4C  Wrist    3.5 <3.7 25.9 >15.0 Wrist 5th Digit  3.5 14.0 40 >38  Right Ulnar Anti Sensory (5th Digit)  32.1C  Wrist    3.4 <3.7 19.5 >15.0 Wrist 5th Digit 3.4 14.0 41 >38   Motor Summary Table   Stim Site NR Onset (ms) Norm Onset (ms) O-P Amp (mV) Norm O-P Amp Site1 Site2 Delta-0 (ms) Dist (cm) Vel (m/s) Norm Vel (m/s)  Left Median Motor (Abd Poll Brev)  32.3C  Wrist    *4.7 <4.2 5.7 >5 Elbow Wrist 4.3 21.0 *49 >50  Elbow    9.0  5.5         Right Median Motor (Abd Poll Brev)  31.8C  Wrist    *5.2 <4.2 5.9 >5 Elbow Wrist 4.3 20.0 *47 >50  Elbow    9.5  5.7         Left Ulnar Motor (Abd Dig Min)  32.4C  Wrist    2.7 <4.2 7.1 >3 B Elbow Wrist 3.3 20.0 61 >53  B Elbow    6.0  6.4  A Elbow B Elbow 1.7 10.0 59 >53  A Elbow    7.7  6.2         Right Ulnar Motor (Abd Dig Min)  32C  Wrist    2.8 <4.2 8.0 >3 B Elbow Wrist 3.1 19.5 63 >53  B Elbow    5.9  7.4  A Elbow B Elbow 1.3 11.0 85 >53  A Elbow    7.2  7.0          EMG   Side Muscle Nerve Root Ins Act Fibs Psw Amp Dur Poly Recrt Int Fraser Din Comment  Right Abd Poll Brev Median C8-T1 Nml Nml Nml Nml Nml 0 Nml Nml   Right 1stDorInt Ulnar C8-T1 Nml Nml Nml Nml Nml 0 Nml Nml   Right PronatorTeres Median C6-7 Nml Nml Nml Nml Nml 0 Nml Nml   Right Biceps Musculocut C5-6 Nml Nml Nml Nml Nml 0 Nml Nml   Right Deltoid Axillary C5-6 Nml Nml Nml Nml Nml 0 Nml Nml     Nerve Conduction Studies Anti Sensory Left/Right Comparison   Stim Site L Lat (ms) R Lat (ms) L-R Lat (ms) L Amp (V) R Amp (V) L-R Amp (%) Site1 Site2 L Vel (m/s) R Vel (m/s) L-R Vel (m/s)  Median Acr Palm Anti Sensory (2nd Digit)  32C  Wrist *4.1 *5.1 1.0 22.2 13.8 37.8 Wrist Palm     Palm *2.2 *4.4 2.2 12.9 2.1 83.7       Radial Anti Sensory (Base 1st Digit)  31.9C  Wrist 2.1 2.1 0.0 34.6 32.0 7.5 Wrist Base 1st Digit     Ulnar Anti Sensory (5th Digit)  32.4C  Wrist 3.5 3.4 0.1 25.9 19.5 24.7 Wrist 5th Digit 40 41 1   Motor Left/Right Comparison   Stim Site L Lat (ms) R Lat (ms) L-R Lat (ms) L Amp (mV) R Amp (mV)  L-R Amp (%) Site1 Site2 L Vel (m/s) R Vel (m/s) L-R Vel (m/s)  Median Motor (Abd Poll Brev)  32.3C  Wrist *4.7 *5.2 0.5 5.7 5.9 3.4 Elbow Wrist *49 *47 2  Elbow 9.0 9.5 0.5 5.5 5.7 3.5       Ulnar Motor (Abd Dig Min)  32.4C  Wrist 2.7 2.8 0.1 7.1 8.0 11.3 B Elbow Wrist 61 63 2  B Elbow 6.0 5.9 0.1 6.4 7.4 13.5 A Elbow B Elbow 59 85 *26  A Elbow 7.7 7.2 0.5 6.2 7.0 11.4          Waveforms:                      Clinical History: No specialty comments available.     Objective:  VS:  HT:    WT:   BMI:     BP:   HR: bpm  TEMP: ( )  RESP:  Physical Exam Musculoskeletal:        General: No tenderness.     Comments: Inspection reveals no atrophy of the bilateral APB or FDI or hand intrinsics. There is no swelling, color changes, allodynia or dystrophic changes. There is 5 out of 5 strength in the bilateral wrist extension, finger abduction and long finger flexion. There is intact sensation to light touch in all dermatomal and peripheral nerve distributions.  There is a negative Hoffmann's test bilaterally.  Skin:    General: Skin is warm and dry.     Findings: No erythema or rash.  Neurological:     General: No focal deficit present.     Mental Status: He is alert and oriented to person, place, and time.     Sensory: No sensory deficit.     Motor: No weakness or abnormal muscle tone.     Coordination: Coordination normal.     Gait: Gait normal.  Psychiatric:        Mood and Affect: Mood normal.        Behavior: Behavior normal.        Thought Content: Thought content normal.      Imaging: No results found.

## 2022-06-07 ENCOUNTER — Ambulatory Visit: Payer: Managed Care, Other (non HMO) | Admitting: Sports Medicine

## 2022-06-16 ENCOUNTER — Ambulatory Visit: Payer: Managed Care, Other (non HMO) | Admitting: Sports Medicine

## 2022-06-19 ENCOUNTER — Other Ambulatory Visit: Payer: Self-pay | Admitting: Family Medicine

## 2022-06-19 DIAGNOSIS — E7800 Pure hypercholesterolemia, unspecified: Secondary | ICD-10-CM

## 2022-06-24 ENCOUNTER — Other Ambulatory Visit: Payer: Self-pay | Admitting: Sports Medicine

## 2022-07-19 ENCOUNTER — Other Ambulatory Visit: Payer: Self-pay | Admitting: Family Medicine

## 2022-07-19 ENCOUNTER — Other Ambulatory Visit: Payer: Self-pay | Admitting: Sports Medicine

## 2022-07-19 DIAGNOSIS — I10 Essential (primary) hypertension: Secondary | ICD-10-CM

## 2022-07-20 ENCOUNTER — Telehealth: Payer: Self-pay | Admitting: Sports Medicine

## 2022-07-20 MED ORDER — LOSARTAN POTASSIUM 50 MG PO TABS: 50.0000 mg | ORAL_TABLET | Freq: Every day | ORAL | 0 refills | Status: DC

## 2022-07-20 NOTE — Telephone Encounter (Signed)
Called pt 1X and left vm for pt to call and set an appt with Dr Rolena Infante for hand pain

## 2022-07-22 ENCOUNTER — Ambulatory Visit: Payer: Managed Care, Other (non HMO) | Admitting: Family Medicine

## 2022-07-22 ENCOUNTER — Encounter: Payer: Self-pay | Admitting: Family Medicine

## 2022-07-22 VITALS — BP 140/96 | HR 71 | Temp 97.4°F | Ht 67.0 in | Wt 205.8 lb

## 2022-07-22 DIAGNOSIS — F341 Dysthymic disorder: Secondary | ICD-10-CM

## 2022-07-22 DIAGNOSIS — Z23 Encounter for immunization: Secondary | ICD-10-CM

## 2022-07-22 DIAGNOSIS — M19042 Primary osteoarthritis, left hand: Secondary | ICD-10-CM

## 2022-07-22 DIAGNOSIS — I10 Essential (primary) hypertension: Secondary | ICD-10-CM

## 2022-07-22 DIAGNOSIS — M19041 Primary osteoarthritis, right hand: Secondary | ICD-10-CM | POA: Diagnosis not present

## 2022-07-22 HISTORY — DX: Dysthymic disorder: F34.1

## 2022-07-22 MED ORDER — LOSARTAN POTASSIUM 100 MG PO TABS: 100.0000 mg | ORAL_TABLET | Freq: Every day | ORAL | 1 refills | Status: DC

## 2022-07-22 MED ORDER — DICLOFENAC SODIUM 75 MG PO TBEC: 75.0000 mg | DELAYED_RELEASE_TABLET | Freq: Two times a day (BID) | ORAL | 5 refills | Status: DC | PRN

## 2022-07-22 MED ORDER — PAROXETINE HCL 40 MG PO TABS: 40.0000 mg | ORAL_TABLET | ORAL | 1 refills | Status: DC

## 2022-07-22 NOTE — Progress Notes (Signed)
Established Patient Office Visit   Subjective:  Patient ID: Harold Morgan, male    DOB: Oct 16, 1965  Age: 57 y.o. MRN: [id number]  Chief Complaint  Patient presents with   Follow-up    6 month follow up, no concerns. Patient not fasting.     HPI Encounter Diagnoses  Name Primary?   Essential hypertension Yes   Persistent depressive disorder    Arthritis of both hands    Need for immunization against influenza    Follow-up of hypertension, depression and arthritis of both hands.  Using Voltaren regularly.  He is having no issues with reflux and indigestion or nausea.  Effective Paxil 20 seems to have leveled out.  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  He continues to mourn his sister who recently passed.  Blood pressure at home has been running in the 140s over 90s.   Review of Systems  Constitutional: Negative.   HENT: Negative.    Eyes:  Negative for blurred vision, discharge and redness.  Respiratory: Negative.    Cardiovascular: Negative.   Gastrointestinal:  Negative for abdominal pain.  Genitourinary: Negative.   Musculoskeletal: Negative.  Negative for myalgias.  Skin:  Negative for rash.  Neurological:  Negative for tingling, loss of consciousness and weakness.  Endo/Heme/Allergies:  Negative for polydipsia.     Current Outpatient Medications:    [employer] ACID CONTROLLER 10 MG tablet, TAKE 1 TABLET BY MOUTH EVERYDAY AT BEDTIME, Disp: 90 tablet, Rfl: 1   cyanocobalamin (VITAMIN B12) 1000 MCG tablet, TAKE 1 TABLET BY MOUTH EVERY DAY, Disp: 90 tablet, Rfl: 4   losartan (COZAAR) 100 MG tablet, Take 1 tablet (100 mg total) by mouth daily., Disp: 90 tablet, Rfl: 1   omeprazole (PRILOSEC) 40 MG capsule, Take 1 capsule (40 mg total) by mouth daily., Disp: 90 capsule, Rfl: 2   PARoxetine (PAXIL) 40 MG tablet, Take 1 tablet (40 mg total) by mouth every morning., Disp: 90 tablet, Rfl: 1   rosuvastatin (CRESTOR) 40 MG tablet, TAKE 1 TABLET(40 MG) BY MOUTH DAILY, Disp: 90 tablet, Rfl:  1   diclofenac (VOLTAREN) 75 MG EC tablet, Take 1 tablet (75 mg total) by mouth 2 (two) times daily as needed. Take with food., Disp: 60 tablet, Rfl: 5   Objective:     BP (!) 140/96 (BP Location: Right Arm, Patient Position: Sitting, Cuff Size: Normal)   Pulse 71   Temp (!) 97.4 F (36.3 C) (Temporal)   Ht '5\' 7"'$  (1.702 m)   Wt 205 lb 12.8 oz (93.4 kg)   SpO2 97%   BMI 32.23 kg/m  BP Readings from Last 3 Encounters:  07/22/22 (!) 140/96  05/04/22 (!) 142/76  12/22/21 134/90   Wt Readings from Last 3 Encounters:  07/22/22 205 lb 12.8 oz (93.4 kg)  05/04/22 195 lb (88.5 kg)  12/22/21 194 lb (88 kg)      Physical Exam Constitutional:      General: He is not in acute distress.    Appearance: Normal appearance. He is not ill-appearing, toxic-appearing or diaphoretic.  HENT:     Head: Normocephalic and atraumatic.     Right Ear: External ear normal.     Left Ear: External ear normal.  Eyes:     General: No scleral icterus.       Right eye: No discharge.        Left eye: No discharge.     Extraocular Movements: Extraocular movements intact.     Conjunctiva/sclera: Conjunctivae normal.  Pulmonary:  Effort: Pulmonary effort is normal. No respiratory distress.  Skin:    General: Skin is warm and dry.  Neurological:     Mental Status: He is alert and oriented to person, place, and time.  Psychiatric:        Mood and Affect: Mood normal.        Behavior: Behavior normal.      No results found for any visits on 07/22/22.    The 10-year ASCVD risk score (Arnett DK, et al., 2019) is: 8%    Assessment & Plan:   Essential hypertension -     Losartan Potassium; Take 1 tablet (100 mg total) by mouth daily.  Dispense: 90 tablet; Refill: 1 -     Basic metabolic panel  Persistent depressive disorder -     PARoxetine HCl; Take 1 tablet (40 mg total) by mouth every morning.  Dispense: 90 tablet; Refill: 1  Arthritis of both hands -     Diclofenac Sodium; Take 1  tablet (75 mg total) by mouth 2 (two) times daily as needed. Take with food.  Dispense: 60 tablet; Refill: 5  Need for immunization against influenza -     Flu Vaccine QUAD 67moIM (Fluarix, Fluzone & Alfiuria Quad PF)    Return in about 3 months (around 10/21/2022).  Return in 4 to 6 weeks if not improving.  Advised him to use the diclofenac sparingly.  Have increased Paxil to 40 mg daily.  Have increased losartan to 100 mg daily.  He will be more mindful of the sodium in his diet.  Information was given on managing hypertension.  WLibby Maw MD

## 2022-07-23 LAB — BASIC METABOLIC PANEL
BUN/Creatinine Ratio: 13 (ref 9–20)
BUN: 13 mg/dL (ref 6–24)
CO2: 23 mmol/L (ref 20–29)
Calcium: 8.9 mg/dL (ref 8.7–10.2)
Chloride: 105 mmol/L (ref 96–106)
Creatinine, Ser: 1 mg/dL (ref 0.76–1.27)
Glucose: 85 mg/dL (ref 70–99)
Potassium: 4.1 mmol/L (ref 3.5–5.2)
Sodium: 142 mmol/L (ref 134–144)
eGFR: 88 mL/min/{1.73_m2} (ref >59)

## 2022-08-17 ENCOUNTER — Encounter: Payer: Self-pay | Admitting: Family Medicine

## 2022-09-16 ENCOUNTER — Encounter: Payer: Self-pay | Admitting: Family Medicine

## 2022-09-16 ENCOUNTER — Ambulatory Visit: Payer: Managed Care, Other (non HMO) | Admitting: Family Medicine

## 2022-09-16 VITALS — BP 132/80 | HR 72 | Temp 97.1°F | Ht 67.0 in | Wt 199.4 lb

## 2022-09-16 DIAGNOSIS — R0609 Other forms of dyspnea: Secondary | ICD-10-CM

## 2022-09-16 DIAGNOSIS — F341 Dysthymic disorder: Secondary | ICD-10-CM | POA: Diagnosis not present

## 2022-09-16 DIAGNOSIS — R079 Chest pain, unspecified: Secondary | ICD-10-CM | POA: Diagnosis not present

## 2022-09-16 DIAGNOSIS — K5901 Slow transit constipation: Secondary | ICD-10-CM

## 2022-09-16 DIAGNOSIS — R0683 Snoring: Secondary | ICD-10-CM | POA: Diagnosis not present

## 2022-09-16 DIAGNOSIS — Z1211 Encounter for screening for malignant neoplasm of colon: Secondary | ICD-10-CM

## 2022-09-16 DIAGNOSIS — I10 Essential (primary) hypertension: Secondary | ICD-10-CM

## 2022-09-16 HISTORY — DX: Slow transit constipation: K59.01

## 2022-09-16 HISTORY — DX: Chest pain, unspecified: R07.9

## 2022-09-16 HISTORY — DX: Other forms of dyspnea: R06.09

## 2022-09-16 MED ORDER — DOCUSATE SODIUM 100 MG PO CAPS: 100.0000 mg | ORAL_CAPSULE | Freq: Two times a day (BID) | ORAL | 0 refills | Status: DC

## 2022-09-16 NOTE — Progress Notes (Signed)
Established Patient Office Visit   Subjective:  Patient ID: Harold Morgan, male    DOB: [birth year]  Age: 57 y.o. MRN: [id number]  Chief Complaint  Patient presents with   Shortness of Breath    SOB, chest pains, numbness in arms symptoms x 1 week. Possible kidney stones     Shortness of Breath Associated symptoms include chest pain. Pertinent negatives include no abdominal pain or rash.   Encounter Diagnoses  Name Primary?   Persistent depressive disorder Yes   Snores    Screening for colon cancer    Chest pain, unspecified type    DOE (dyspnea on exertion)    Slow transit constipation    Essential hypertension    Ongoing history of intermittent shortness of breath with activity that is recently taken twist.  He did have an episode this past week associated with some chest pain moving up into his left jaw and left arm.  There was no nausea or diaphoresis.  He has never smoked.  He is on a statin.  HDL cholesterol is low.  Blood pressure is well-controlled.  Has experienced some left lower quadrant pain back pain.  There has been no fevers chills blood in his stool.  He does have issues with constipation.  He is accompanied by his wife [name] today.  He snores loudly and stops breathing throughout the night.  Blood pressure under better control status post increasing losartan.  Mood has responded favorably to increasing Paxil.   Review of Systems  Constitutional: Negative.   HENT: Negative.    Eyes:  Negative for blurred vision, discharge and redness.  Respiratory:  Positive for shortness of breath.   Cardiovascular:  Positive for chest pain.  Gastrointestinal:  Negative for abdominal pain.  Genitourinary: Negative.   Musculoskeletal: Negative.  Negative for myalgias.  Skin:  Negative for rash.  Neurological:  Negative for tingling, loss of consciousness and weakness.  Endo/Heme/Allergies:  Negative for polydipsia.     Current Outpatient Medications:    [employer] ACID  CONTROLLER 10 MG tablet, TAKE 1 TABLET BY MOUTH EVERYDAY AT BEDTIME, Disp: 90 tablet, Rfl: 1   cyanocobalamin (VITAMIN B12) 1000 MCG tablet, TAKE 1 TABLET BY MOUTH EVERY DAY, Disp: 90 tablet, Rfl: 4   diclofenac (VOLTAREN) 75 MG EC tablet, Take 1 tablet (75 mg total) by mouth 2 (two) times daily as needed. Take with food., Disp: 60 tablet, Rfl: 5   docusate sodium (COLACE) 100 MG capsule, Take 1 capsule (100 mg total) by mouth 2 (two) times daily., Disp: 90 capsule, Rfl: 0   losartan (COZAAR) 100 MG tablet, Take 1 tablet (100 mg total) by mouth daily., Disp: 90 tablet, Rfl: 1   omeprazole (PRILOSEC) 40 MG capsule, Take 1 capsule (40 mg total) by mouth daily., Disp: 90 capsule, Rfl: 2   PARoxetine (PAXIL) 40 MG tablet, Take 1 tablet (40 mg total) by mouth every morning., Disp: 90 tablet, Rfl: 1   rosuvastatin (CRESTOR) 40 MG tablet, TAKE 1 TABLET(40 MG) BY MOUTH DAILY, Disp: 90 tablet, Rfl: 1   Objective:     BP 132/80 (BP Location: Right Arm, Patient Position: Sitting, Cuff Size: Large)   Pulse 72   Temp (!) 97.1 F (36.2 C) (Temporal)   Ht '5\' 7"'$  (1.702 m)   Wt 199 lb 6.4 oz (90.4 kg)   SpO2 97%   BMI 31.23 kg/m  BP Readings from Last 3 Encounters:  09/16/22 132/80  07/22/22 (!) 140/96  05/04/22 (!) 142/76  Wt Readings from Last 3 Encounters:  09/16/22 199 lb 6.4 oz (90.4 kg)  07/22/22 205 lb 12.8 oz (93.4 kg)  05/04/22 195 lb (88.5 kg)      Physical Exam   No results found for any visits on 09/16/22.    The 10-year ASCVD risk score (Arnett DK, et al., 2019) is: 7.2%    Assessment & Plan:   Persistent depressive disorder  Snores -     Ambulatory referral to Pulmonology  Screening for colon cancer -     Ambulatory referral to Gastroenterology  Chest pain, unspecified type -     EKG 12-Lead -     Basic metabolic panel -     CBC -     Ambulatory referral to Cardiology -     Troponin I -  DOE (dyspnea on exertion) -     EKG 12-Lead -     Ambulatory referral  to Cardiology -     Ambulatory referral to Pulmonology -     Troponin I -  Slow transit constipation -     Docusate Sodium; Take 1 capsule (100 mg total) by mouth 2 (two) times daily.  Dispense: 90 capsule; Refill: 0  Essential hypertension    Return in about 4 weeks (around 10/14/2022).  EKG today shows normal sinus rhythm with bradycardia.  There are no ST segment changes or flipped T waves.  To emergency room for evaluation increasing and or persisting symptoms of chest pain with DOE.  Information was given on chest pain and constipation.  Continue losartan and Paxil.  Libby Maw, MD

## 2022-09-17 LAB — CBC
Hematocrit: 41.6 % (ref 37.5–51.0)
Hemoglobin: 14.2 g/dL (ref 13.0–17.7)
MCH: 31.7 pg (ref 26.6–33.0)
MCHC: 34.1 g/dL (ref 31.5–35.7)
MCV: 93 fL (ref 79–97)
Platelets: 298 10*3/uL (ref 150–450)
RBC: 4.48 x10E6/uL (ref 4.14–5.80)
RDW: 12.4 % (ref 11.6–15.4)
WBC: 4.4 10*3/uL (ref 3.4–10.8)

## 2022-09-17 LAB — BASIC METABOLIC PANEL
BUN/Creatinine Ratio: 17 (ref 9–20)
BUN: 18 mg/dL (ref 6–24)
CO2: 23 mmol/L (ref 20–29)
Calcium: 8.9 mg/dL (ref 8.7–10.2)
Chloride: 105 mmol/L (ref 96–106)
Creatinine, Ser: 1.08 mg/dL (ref 0.76–1.27)
Glucose: 92 mg/dL (ref 70–99)
Potassium: 4.5 mmol/L (ref 3.5–5.2)
Sodium: 143 mmol/L (ref 134–144)
eGFR: 81 mL/min/{1.73_m2} (ref >59)

## 2022-09-17 LAB — TROPONIN I: Troponin I: 3 ng/L (ref <=47)

## 2022-10-11 DIAGNOSIS — M1990 Unspecified osteoarthritis, unspecified site: Secondary | ICD-10-CM | POA: Insufficient documentation

## 2022-10-11 DIAGNOSIS — N200 Calculus of kidney: Secondary | ICD-10-CM | POA: Insufficient documentation

## 2022-10-11 DIAGNOSIS — F32A Depression, unspecified: Secondary | ICD-10-CM | POA: Insufficient documentation

## 2022-10-11 DIAGNOSIS — F419 Anxiety disorder, unspecified: Secondary | ICD-10-CM | POA: Insufficient documentation

## 2022-10-11 DIAGNOSIS — R12 Heartburn: Secondary | ICD-10-CM | POA: Insufficient documentation

## 2022-10-11 DIAGNOSIS — E785 Hyperlipidemia, unspecified: Secondary | ICD-10-CM | POA: Insufficient documentation

## 2022-10-14 ENCOUNTER — Ambulatory Visit: Payer: Managed Care, Other (non HMO) | Admitting: Family Medicine

## 2022-10-19 ENCOUNTER — Ambulatory Visit: Payer: Managed Care, Other (non HMO) | Admitting: Family Medicine

## 2022-10-21 ENCOUNTER — Institutional Professional Consult (permissible substitution): Payer: Managed Care, Other (non HMO) | Admitting: Pulmonary Disease

## 2022-10-27 ENCOUNTER — Ambulatory Visit: Payer: Managed Care, Other (non HMO) | Attending: Cardiology | Admitting: Cardiology

## 2022-10-27 ENCOUNTER — Encounter: Payer: Self-pay | Admitting: Cardiology

## 2022-10-27 ENCOUNTER — Other Ambulatory Visit: Payer: Self-pay | Admitting: Family Medicine

## 2022-10-27 VITALS — BP 136/80 | HR 141 | Ht 67.0 in | Wt 204.1 lb

## 2022-10-27 DIAGNOSIS — I4891 Unspecified atrial fibrillation: Secondary | ICD-10-CM | POA: Insufficient documentation

## 2022-10-27 DIAGNOSIS — I209 Angina pectoris, unspecified: Secondary | ICD-10-CM | POA: Insufficient documentation

## 2022-10-27 DIAGNOSIS — I10 Essential (primary) hypertension: Secondary | ICD-10-CM

## 2022-10-27 DIAGNOSIS — R0609 Other forms of dyspnea: Secondary | ICD-10-CM | POA: Diagnosis not present

## 2022-10-27 DIAGNOSIS — E7800 Pure hypercholesterolemia, unspecified: Secondary | ICD-10-CM | POA: Diagnosis not present

## 2022-10-27 MED ORDER — METOPROLOL SUCCINATE ER 25 MG PO TB24: 25.0000 mg | ORAL_TABLET | Freq: Every day | ORAL | 3 refills | Status: DC

## 2022-10-27 MED ORDER — NITROGLYCERIN 0.4 MG SL SUBL: 0.4000 mg | SUBLINGUAL_TABLET | SUBLINGUAL | 6 refills | Status: AC | PRN

## 2022-10-27 MED ORDER — ASPIRIN 81 MG PO TBEC: 81.0000 mg | DELAYED_RELEASE_TABLET | Freq: Every day | ORAL | 3 refills | Status: DC

## 2022-10-27 NOTE — Progress Notes (Signed)
Cardiology Office Note:    Date:  10/27/2022   ID:  Harold Morgan, DOB 1965/09/17, MRN [id number]  PCP:  Harold Maw, MD  Cardiologist:  Harold Lindau, MD   Referring MD: Harold Morgan,*    ASSESSMENT:    1. Essential hypertension   2. DOE (dyspnea on exertion)   3. Elevated LDL cholesterol level   4. Angina pectoris ([place])   5. New onset atrial fibrillation ([facility])    PLAN:    In order of problems listed above:  Newly diagnosed atrial fibrillation: Patient has no known diagnosis of atrial fibrillation.  Clearly the EKG reveals atrial fibrillation with elevated ventricular rate.  [name] is 1 at this time.  I have switched his medication from ARB to beta-blocker.  He will start Toprol-XL 50 mg in the morning beginning tomorrow.  He appears anxious today. Angina pectoris: Symptoms are significant and I gave him noninvasive and invasive evaluation options.In view of the patient's symptoms, I discussed with the patient options for evaluation. Invasive and noninvasive options were given to the patient. I discussed stress testing and coronary angiography and left heart catheterization at length. Benefits, pros and cons of each approach were discussed at length. Patient had multiple questions which were answered to the patient's satisfaction. Patient opted for invasive evaluation and we will set up for coronary angiography and left heart catheterization. Further recommendations will be made based on the findings with coronary angiography. In the interim if the patient has any significant symptoms in hospital to the nearest emergency room.  In the interim he has been advised to take a coated baby aspirin on a daily basis and nitroglycerin also has been sent to and is protocol explained. Cardiac murmur: Echocardiogram will be done to assess murmur heard on auscultation.  Will try to get the study before the cardiac catheterization if possible. Essential  hypertension: Stable blood pressure. Mixed dyslipidemia: On lipid-lowering medications followed by primary care. Further recommendations will be made based on the findings of the coronary angiography.  It would also help me address antiarrhythmic therapy as necessary.  He will he will be seen in follow-up appointment after the coronary angiography.   Medication Adjustments/Labs and Tests Ordered: Current medicines are reviewed at length with the patient today.  Concerns regarding medicines are outlined above.  No orders of the defined types were placed in this encounter.  No orders of the defined types were placed in this encounter.    History of Present Illness:    Harold Morgan is a 57 y.o. male who is being seen today for the evaluation of chest pain and dyspnea on exertion at the request of Harold Morgan,*.  Patient is a pleasant 57 year old male.  He has past medical history of essential hypertension and dyslipidemia.  He is a non-smoker.  He mentions to me that he has dyspnea on exertion in the past few weeks.  He also has chest tightness radiating to the left arm.  When this happens he feels dizzy.  No orthopnea or PND.  At the time of my evaluation, the patient is alert awake oriented and in no distress.  He is wife accompanies him for this visit  Past Medical History:  Diagnosis Date   Anxiety    on meds   Arthritis    generalized   Arthritis of both hands 02/27/2020   Arthritis of carpometacarpal (CMC) joints of both thumbs 12/29/2021   B12 deficiency 01/30/2021   Benign  prostatic hyperplasia with urinary hesitancy 12/11/2020   Bilateral hand numbness 12/29/2021   Chest pain 09/16/2022   Depression    on meds   DOE (dyspnea on exertion) 09/16/2022   Elevated LDL cholesterol level 02/27/2020   Essential hypertension 05/06/2020   Gastroesophageal reflux disease 01/30/2021   Heartburn    HLD (hyperlipidemia)    on meds   Kidney stone    Left leg pain  10/24/2017   Lower respiratory infection 02/27/2020   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   Nonintractable episodic headache 10/17/2017   Other microscopic hematuria 01/29/2021   Pain and swelling of right elbow 01/29/2021   Persistent depressive disorder 07/22/2022   Right testicular pain 07/07/2020   Screening for colon cancer 01/30/2021   Slow transit constipation 09/16/2022   Snores 11/12/2021   Urinary frequency 07/07/2020    Past Surgical History:  Procedure Laterality Date   LITHOTRIPSY     TONSILLECTOMY     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 Take with food.   docusate sodium (COLACE) 100 MG capsule Take 1 capsule (100 mg total) by mouth 2 (two) times daily.   losartan (COZAAR) 100 MG tablet Take 1 tablet (100 mg total) by mouth daily.   omeprazole (PRILOSEC) 40 MG capsule Take 1 capsule (40 mg total) by mouth daily.   PARoxetine (PAXIL) 40 MG tablet Take 1 tablet (40 mg total) by mouth every morning.   rosuvastatin (CRESTOR) 40 MG tablet TAKE 1 TABLET(40 MG) BY MOUTH DAILY     Allergies:   Codeine   Social History   Socioeconomic History   Marital status: Married    Spouse name: Not on file   Number of children: 2   Years of education: Not on file   Highest education level: Not on file  Occupational History   Not on file  Tobacco Use   Smoking status: Never   Smokeless tobacco: Former    Types: Nurse, children's Use: Never used  Substance and Sexual Activity   Alcohol use: Yes    Comment: special occasions   Drug use: No   Sexual activity: Yes  Other Topics Concern   Not on file  Social History Narrative   Not on  file   Social Determinants of Health   Financial Resource Strain: Not on file  Food Insecurity: Not on file  Transportation Needs: Not on file  Physical Activity: Not on file  Stress: Not on file  Social Connections: Not on file     Family History: The patient's family history includes Aneurysm (age of onset: 31) in his maternal uncle; Cancer in his sister; Emphysema in his father. There is no history of Bladder Cancer, Prostate cancer, Colon polyps, Colon cancer, Esophageal cancer, or Rectal cancer.  ROS:   Please see the history of present illness.    All other systems reviewed and are negative.  EKGs/Labs/Other Studies Reviewed:    The following studies were reviewed today: EKG reveals atrial fibrillation and elevated heart rate.   Recent Labs: 03/10/2022: ALT 35 09/16/2022: BUN 18; Creatinine, Ser 1.08; Hemoglobin  14.2; Platelets 298; Potassium 4.5; Sodium 143  Recent Lipid Panel    Component Value Date/Time   CHOL 156 03/10/2022 0800   TRIG 117 03/10/2022 0800   HDL 38 (L) 03/10/2022 0800   CHOLHDL 4.1 03/10/2022 0800   LDLCALC 97 03/10/2022 0800   LDLDIRECT 99 03/10/2022 0800    Physical Exam:    VS:  BP 136/80   Pulse (!) 141   Ht 5\' 7"  (1.702 m)   Wt 204 lb 1.9 oz (92.6 kg)   SpO2 96%   BMI 31.97 kg/m     Wt Readings from Last 3 Encounters:  10/27/22 204 lb 1.9 oz (92.6 kg)  09/16/22 199 lb 6.4 oz (90.4 kg)  07/22/22 205 lb 12.8 oz (93.4 kg)     GEN: Patient is in no acute distress HEENT: Normal NECK: No JVD; No carotid bruits LYMPHATICS: No lymphadenopathy CARDIAC: S1 S2 irregular 2/6 systolic murmur at the apex. RESPIRATORY:  Clear to auscultation without rales, wheezing or rhonchi  ABDOMEN: Soft, non-tender, non-distended MUSCULOSKELETAL:  No edema; No deformity  SKIN: Warm and dry NEUROLOGIC:  Alert and oriented x 3 PSYCHIATRIC:  Normal affect    Signed, Harold Lindau, MD  10/27/2022 2:31 PM    [facility]

## 2022-10-27 NOTE — Patient Instructions (Signed)
Medication Instructions:  Your physician has recommended you make the following change in your medication:   Take 81 mg coated aspirin daily.  Use nitroglycerin 1 tablet placed under the tongue at the first sign of chest pain or an angina attack. 1 tablet may be used every 5 minutes as needed, for up to 15 minutes. Do not take more than 3 tablets in 15 minutes. If pain persist call 911 or go to the nearest ED.  Stop Cozaar  Start Toprol XL 50 mg every AM  *If you need a refill on your cardiac medications before your next appointment, please call your pharmacy*   Lab Work: Your physician recommends that you have a BMET and CBC today in the office for your upcoming procedure.  If you have labs (blood work) drawn today and your tests are completely normal, you will receive your results only by: [place] (if you have MyChart) OR A paper copy in the mail If you have any lab test that is abnormal or we need to change your treatment, we will call you to review the results.   Testing/Procedures:  [place] A DEPT OF [place], [place] [id number] [facility] [place] [address] Dept: [phone] Loc: [place]  10/27/2022  You are scheduled for a Cardiac Catheterization on Monday, March 25 with Dr. Peter Martinique.  1. Please arrive at the [facility] (Main Entrance A) at [facility]: [address] [place], [place] [address] at 7:00 AM (This time is two hours before your procedure to ensure your preparation). Free valet parking service is available.   Special note: Every effort is made to have your procedure done on time. Please understand that emergencies sometimes delay scheduled procedures.  2. Diet: Do not eat solid foods after midnight.  The patient may have clear liquids until 5am upon the day of the procedure.  3. Labs: You had your labs done  today in the office.  4. Medication instructions in preparation for your procedure:   Contrast Allergy: No  Stop taking, Voltaren or Cataflam or Arthrotec (Diclofenac) Monday, March 25,  On the morning of your procedure, take your Aspirin 81 mg and any morning medicines NOT listed above.  You may use sips of water.  5. Plan for one night stay--bring personal belongings. 6. Bring a current list of your medications and current insurance cards. 7. You MUST have a responsible person to drive you home. 8. Someone MUST be with you the first 24 hours after you arrive home or your discharge will be delayed. 9. Please wear clothes that are easy to get on and off and wear slip-on shoes.  Thank you for allowing [place] to care for you!   -- [facility]    Follow-Up: At [facility], you and your health needs are our priority.  As part of our continuing mission to provide you with exceptional heart care, we have created designated Provider Care Teams.  These Care Teams include your primary Cardiologist (physician) and Advanced Practice Providers (APPs -  Physician Assistants and Nurse Practitioners) who all work together to provide you with the care you need, when you need it.  We recommend signing up for the patient portal called "MyChart".  Sign up information is provided on this After Visit Summary.  MyChart is used to connect with patients for Virtual Visits (Telemedicine).  Patients are  able to view lab/test results, encounter notes, upcoming appointments, etc.  Non-urgent messages can be sent to your provider as well.   To learn more about what you can do with MyChart, go to [email or web address].    Your next appointment:   1 week(s)  The format for your next appointment:   In Person  Provider:   Jyl Heinz, MD   Other Instructions  Coronary Angiogram With Stent Coronary angiogram with stent placement is a procedure to widen or open a narrow  blood vessel of the heart (coronary artery). Arteries may become blocked by cholesterol buildup (plaques) in the lining of the artery wall. When a coronary artery becomes partially blocked, blood flow to that area decreases. This may lead to chest pain or a heart attack (myocardial infarction). A stent is a small piece of metal that looks like mesh or spring. Stent placement may be done as treatment after a heart attack, or to prevent a heart attack if a blocked artery is found by a coronary angiogram. Let your health care provider know about: Any allergies you have, including allergies to medicines or contrast dye. All medicines you are taking, including vitamins, herbs, eye drops, creams, and over-the-counter medicines. Any problems you or family members have had with anesthetic medicines. Any blood disorders you have. Any surgeries you have had. Any medical conditions you have, including kidney problems or kidney failure. Whether you are pregnant or may be pregnant. Whether you are breastfeeding. What are the risks? Generally, this is a safe procedure. However, serious problems may occur, including: Damage to nearby structures or organs, such as the heart, blood vessels, or kidneys. A return of blockage. Bleeding, infection, or bruising at the insertion site. A collection of blood under the skin (hematoma) at the insertion site. A blood clot in another part of the body. Allergic reaction to medicines or dyes. Bleeding into the abdomen (retroperitoneal bleeding). Stroke (rare). Heart attack (rare). What happens before the procedure? Staying hydrated Follow instructions from your health care provider about hydration, which may include: Up to 2 hours before the procedure - you may continue to drink clear liquids, such as water, clear fruit juice, black coffee, and plain tea.    Eating and drinking restrictions Follow instructions from your health care provider about eating and drinking,  which may include: 8 hours before the procedure - stop eating heavy meals or foods, such as meat, fried foods, or fatty foods. 6 hours before the procedure - stop eating light meals or foods, such as toast or cereal. 2 hours before the procedure - stop drinking clear liquids. Medicines Ask your health care provider about: Changing or stopping your regular medicines. This is especially important if you are taking diabetes medicines or blood thinners. Taking medicines such as aspirin and ibuprofen. These medicines can thin your blood. Do not take these medicines unless your health care provider tells you to take them. Generally, aspirin is recommended before a thin tube, called a catheter, is passed through a blood vessel and inserted into the heart (cardiac catheterization). Taking over-the-counter medicines, vitamins, herbs, and supplements. General instructions Do not use any products that contain nicotine or tobacco for at least 4 weeks before the procedure. These products include cigarettes, e-cigarettes, and chewing tobacco. If you need help quitting, ask your health care provider. Plan to have someone take you home from the hospital or clinic. If you will be going home right after the procedure, plan to have someone with you for 24 hours.  You may have tests and imaging procedures. Ask your health care provider: How your insertion site will be marked. Ask which artery will be used for the procedure. What steps will be taken to help prevent infection. These may include: Removing hair at the insertion site. Washing skin with a germ-killing soap. Taking antibiotic medicine. What happens during the procedure? An IV will be inserted into one of your veins. Electrodes may be placed on your chest to monitor your heart rate during the procedure. You will be given one or more of the following: A medicine to help you relax (sedative). A medicine to numb the area (local anesthetic) for catheter  insertion. A small incision will be made for catheter insertion. The catheter will be inserted into an artery using a guide wire. The location may be in your groin, your wrist, or the fold of your arm (near your elbow). An X-ray procedure (fluoroscopy) will be used to help guide the catheter to the opening of the heart arteries. A dye will be injected into the catheter. X-rays will be taken. The dye helps to show where any narrowing or blockages are located in the arteries. Tell your health care provider if you have chest pain or trouble breathing. A tiny wire will be guided to the blocked spot, and a balloon will be inflated to make the artery wider. The stent will be expanded to crush the plaques into the wall of the vessel. The stent will hold the area open and improve the blood flow. Most stents have a drug coating to reduce the risk of the stent narrowing over time. The artery may be made wider using a drill, laser, or other tools that remove plaques. The catheter will be removed when the blood flow improves. The stent will stay where it was placed, and the lining of the artery will grow over it. A bandage (dressing) will be placed on the insertion site. Pressure will be applied to stop bleeding. The IV will be removed. This procedure may vary among health care providers and hospitals.    What happens after the procedure? Your blood pressure, heart rate, breathing rate, and blood oxygen level will be monitored until you leave the hospital or clinic. If the procedure is done through the leg, you will lie flat in bed for a few hours or for as long as told by your health care provider. You will be instructed not to bend or cross your legs. The insertion site and the pulse in your foot or wrist will be checked often. You may have more blood tests, X-rays, and a test that records the electrical activity of your heart (electrocardiogram, or ECG). Do not drive for 24 hours if you were given a  sedative during your procedure. Summary Coronary angiogram with stent placement is a procedure to widen or open a narrowed coronary artery. This is done to treat heart problems. Before the procedure, let your health care provider know about all the medical conditions and surgeries you have or have had. This is a safe procedure. However, some problems may occur, including damage to nearby structures or organs, bleeding, blood clots, or allergies. Follow your health care provider's instructions about eating, drinking, medicines, and other lifestyle changes, such as quitting tobacco use before the procedure. This information is not intended to replace advice given to you by your health care provider. Make sure you discuss any questions you have with your health care provider. Document Revised: 02/14/2019 Document Reviewed: 02/14/2019 Elsevier Patient Education  [place].  Aspirin and Your Heart Aspirin is a medicine that prevents the platelets in your blood from sticking together. Platelets are the cells that your blood uses for clotting. Aspirin can be used to help reduce the risk of blood clots, heart attacks, and other heart-related problems. What are the risks? Daily use of aspirin can cause side effects. Some of these include: Bleeding. Bleeding can be minor or serious. An example of minor bleeding is bleeding from a cut, and the bleeding does not stop. An example of more serious bleeding is stomach bleeding or, rarely, bleeding into the brain. Your risk of bleeding increases if you are also taking NSAIDs, such as ibuprofen. Increased bruising. Upset stomach. An allergic reaction. People who have growths inside the nose (nasal polyps) have an increased risk of developing an aspirin allergy. How to use aspirin to care for your heart Take aspirin only as told by your health care provider. Make sure that you understand how much to take and what form to take. The two forms of aspirin  are: Non-enteric-coated.This type of aspirin does not have a coating and is absorbed quickly. This type of aspirin also comes in a chewable form. Enteric-coated. This type of aspirin has a coating that releases the medicine very slowly. Enteric-coated aspirin might cause less stomach upset than non-enteric-coated aspirin. This type of aspirin should not be chewed or crushed. Work with your health care provider to find out whether it is safe and beneficial for you to take aspirin daily. Taking aspirin daily may be helpful if: You have had a heart attack or chest pain, or you are at risk for a heart attack. You have a condition in which certain heart vessels are blocked (coronary artery disease), and you have had a procedure to treat it. Examples are: Open-heart surgery, such as coronary artery bypass surgery (CABG). Coronary angioplasty,which is done to widen a blood vessel of your heart. Having a small mesh tube, or stent, placed in your coronary artery. You have had certain types of stroke or a mini-stroke known as a transient ischemic attack (TIA). You have a narrowing of the arteries that supply the limbs (peripheral artery disease, or PAD). You have long-term (chronic) heart rhythm problems, such as atrial fibrillation, and your health care provider thinks aspirin may help. You have valve disease or have had surgery on a valve. You are considered at increased risk of developing coronary artery disease or PAD.    Follow these instructions at home Medicines Take over-the-counter and prescription medicines only as told by your health care provider. If you are taking blood thinners: Talk with your health care provider before you take any medicines that contain aspirin or NSAIDs, such as ibuprofen. These medicines increase your risk for dangerous bleeding. Take your medicine exactly as told, at the same time every day. Avoid activities that could cause injury or bruising, and follow instructions  about how to prevent falls. Wear a medical alert bracelet or carry a card that lists what medicines you take. General instructions Do not drink alcohol if: Your health care provider tells you not to drink. You are pregnant, may be pregnant, or are planning to become pregnant. If you drink alcohol: Limit how much you use to: 0-1 drink a day for women. 0-2 drinks a day for men. Be aware of how much alcohol is in your drink. In the [place] one drink equals one 12 oz bottle of beer (355 mL), one 5 oz glass of wine (  148 mL), or one 1 oz glass of hard liquor (44 mL). Keep all follow-up visits as told by your health care provider. This is important. Where to find more information The American Heart Association: www.heart.org Contact a health care provider if you have: Unusual bleeding or bruising. Stomach pain or nausea. Ringing in your ears. An allergic reaction that causes hives, itchy skin, or swelling of the lips, tongue, or face. Get help right away if: You notice that your bowel movements are bloody, or dark red or black in color. You vomit or cough up blood. You have blood in your urine. You cough, breathe loudly (wheeze), or feel short of breath. You have chest pain, especially if the pain spreads to your arms, back, neck, or jaw. You have a headache with confusion. You have any symptoms of a stroke. "BE FAST" is an easy way to remember the main warning signs of a stroke: B - Balance. Signs are dizziness, sudden trouble walking, or loss of balance. E - Eyes. Signs are trouble seeing or a sudden change in vision. F - Face. Signs are sudden weakness or numbness of the face, or the face or eyelid drooping on one side. A - Arms. Signs are weakness or numbness in an arm. This happens suddenly and usually on one side of the body. S - Speech. Signs are sudden trouble speaking, slurred speech, or trouble understanding what people say. T - Time. Time to call emergency services. Write down what  time symptoms started. You have other signs of a stroke, such as: A sudden, severe headache with no known cause. Nausea or vomiting. Seizure. These symptoms may represent a serious problem that is an emergency. Do not wait to see if the symptoms will go away. Get medical help right away. Call your local emergency services (911 in the [place] Do not drive yourself to the hospital. Summary Aspirin use can help reduce the risk of blood clots, heart attacks, and other heart-related problems. Daily use of aspirin can cause side effects. Take aspirin only as told by your health care provider. Make sure that you understand how much to take and what form to take. Your health care provider will help you determine whether it is safe and beneficial for you to take aspirin daily. This information is not intended to replace advice given to you by your health care provider. Make sure you discuss any questions you have with your health care provider. Document Revised: 04/30/2019 Document Reviewed: 04/30/2019 Elsevier Patient Education  2021 [place]. Nitroglycerin sublingual tablets What is this medicine? NITROGLYCERIN (nye troe GLI ser in) is a type of vasodilator. It relaxes blood vessels, increasing the blood and oxygen supply to your heart. This medicine is used to relieve chest pain caused by angina. It is also used to prevent chest pain before activities like climbing stairs, going outdoors in cold weather, or sexual activity. This medicine may be used for other purposes; ask your health care provider or pharmacist if you have questions. COMMON BRAND NAME(S): Nitroquick, Nitrostat, Nitrotab What should I tell my health care provider before I take this medicine? They need to know if you have any of these conditions: anemia head injury, recent stroke, or bleeding in the brain liver disease previous heart attack an unusual or allergic reaction to nitroglycerin, other medicines, foods, dyes, or  preservatives pregnant or trying to get pregnant breast-feeding How should I use this medicine? Take this medicine by mouth as needed. Use at the first sign of  an angina attack (chest pain or tightness). You can also take this medicine 5 to 10 minutes before an event likely to produce chest pain. Follow the directions exactly as written on the prescription label. Place one tablet under your tongue and let it dissolve. Do not swallow whole. Replace the dose if you accidentally swallow it. It will help if your mouth is not dry. Saliva around the tablet will help it to dissolve more quickly. Do not eat or drink, smoke or chew tobacco while a tablet is dissolving. Sit down when taking this medicine. In an angina attack, you should feel better within 5 minutes after your first dose. You can take a dose every 5 minutes up to a total of 3 doses. If you do not feel better or feel worse after 1 dose, call 9-1-1 at once. Do not take more than 3 doses in 15 minutes. Your health care provider might give you other directions. Follow those directions if he or she does. Do not take your medicine more often than directed. Talk to your health care provider about the use of this medicine in children. Special care may be needed. Overdosage: If you think you have taken too much of this medicine contact a poison control center or emergency room at once. NOTE: This medicine is only for you. Do not share this medicine with others. What if I miss a dose? This does not apply. This medicine is only used as needed. What may interact with this medicine? Do not take this medicine with any of the following medications: certain migraine medicines like ergotamine and dihydroergotamine (DHE) medicines used to treat erectile dysfunction like sildenafil, tadalafil, and vardenafil riociguat This medicine may also interact with the following medications: alteplase aspirin heparin medicines for high blood pressure medicines for  mental depression other medicines used to treat angina phenothiazines like chlorpromazine, mesoridazine, prochlorperazine, thioridazine This list may not describe all possible interactions. Give your health care provider a list of all the medicines, herbs, non-prescription drugs, or dietary supplements you use. Also tell them if you smoke, drink alcohol, or use illegal drugs. Some items may interact with your medicine. What should I watch for while using this medicine? Tell your doctor or health care professional if you feel your medicine is no longer working. Keep this medicine with you at all times. Sit or lie down when you take your medicine to prevent falling if you feel dizzy or faint after using it. Try to remain calm. This will help you to feel better faster. If you feel dizzy, take several deep breaths and lie down with your feet propped up, or bend forward with your head resting between your knees. You may get drowsy or dizzy. Do not drive, use machinery, or do anything that needs mental alertness until you know how this drug affects you. Do not stand or sit up quickly, especially if you are an older patient. This reduces the risk of dizzy or fainting spells. Alcohol can make you more drowsy and dizzy. Avoid alcoholic drinks. Do not treat yourself for coughs, colds, or pain while you are taking this medicine without asking your doctor or health care professional for advice. Some ingredients may increase your blood pressure. What side effects may I notice from receiving this medicine? Side effects that you should report to your doctor or health care professional as soon as possible: allergic reactions (skin rash, itching or hives; swelling of the face, lips, or tongue) low blood pressure (dizziness; feeling faint or  lightheaded, falls; unusually weak or tired) low red blood cell counts (trouble breathing; feeling faint; lightheaded, falls; unusually weak or tired) Side effects that usually do  not require medical attention (report to your doctor or health care professional if they continue or are bothersome): facial flushing (redness) headache nausea, vomiting This list may not describe all possible side effects. Call your doctor for medical advice about side effects. You may report side effects to FDA at 1-800-FDA-1088. Where should I keep my medicine? Keep out of the reach of children. Store at room temperature between 20 and 25 degrees C (68 and 77 degrees F). Store in [employer]. Protect from light and moisture. Keep tightly closed. Throw away any unused medicine after the expiration date. NOTE: This sheet is a summary. It may not cover all possible information. If you have questions about this medicine, talk to your doctor, pharmacist, or health care provider.  2021 Elsevier/Gold Standard (2018-04-26 16:46:32)

## 2022-10-28 ENCOUNTER — Telehealth: Payer: Self-pay | Admitting: *Deleted

## 2022-10-28 LAB — CBC
Hematocrit: 39.7 % (ref 37.5–51.0)
Hemoglobin: 13.5 g/dL (ref 13.0–17.7)
MCH: 31.7 pg (ref 26.6–33.0)
MCHC: 34 g/dL (ref 31.5–35.7)
MCV: 93 fL (ref 79–97)
Platelets: 365 10*3/uL (ref 150–450)
RBC: 4.26 x10E6/uL (ref 4.14–5.80)
RDW: 12.9 % (ref 11.6–15.4)
WBC: 6.6 10*3/uL (ref 3.4–10.8)

## 2022-10-28 LAB — BASIC METABOLIC PANEL
BUN/Creatinine Ratio: 19 (ref 9–20)
BUN: 21 mg/dL (ref 6–24)
CO2: 22 mmol/L (ref 20–29)
Calcium: 9.1 mg/dL (ref 8.7–10.2)
Chloride: 106 mmol/L (ref 96–106)
Creatinine, Ser: 1.11 mg/dL (ref 0.76–1.27)
Glucose: 86 mg/dL (ref 70–99)
Potassium: 4.4 mmol/L (ref 3.5–5.2)
Sodium: 143 mmol/L (ref 134–144)
eGFR: 78 mL/min/{1.73_m2} (ref >59)

## 2022-10-28 NOTE — Telephone Encounter (Signed)
Cardiac Catheterization scheduled at [facility] for: Monday November 01, 2022 9 AM Arrival time [place] En[facility] A at: 7 AM  Nothing to eat after midnight prior to procedure, clear liquids until 5 AM day of procedure.  Medication instructions: -Usual morning medications can be taken with sips of water including aspirin 81 mg.  Confirmed patient has responsible adult to drive home post procedure and be with patient first 24 hours after arriving home.  Plan to go home the same day, you will only stay overnight if medically necessary.  Reviewed procedure instructions with patient.

## 2022-10-29 ENCOUNTER — Telehealth: Payer: Self-pay | Admitting: Family Medicine

## 2022-10-29 ENCOUNTER — Telehealth: Payer: Self-pay

## 2022-10-29 NOTE — Telephone Encounter (Signed)
Caller Name: Beatrix Shipper / daughter Call back phone #: [phone]  [place] Referral for which specialty: Cardiology Preferred provider/office: [facility]  Pt and family would like his referral changed to [facility]. Fax # [phone]

## 2022-10-29 NOTE — Telephone Encounter (Signed)
Cath has been cancelled as his insurance is being appealed. Pt requested to cancel rather than reschedule until decision with insurance approves.

## 2022-11-01 ENCOUNTER — Ambulatory Visit (HOSPITAL_COMMUNITY)
Admission: RE | Admit: 2022-11-01 | Payer: Managed Care, Other (non HMO) | Source: Home / Self Care | Admitting: Cardiology

## 2022-11-01 ENCOUNTER — Encounter (HOSPITAL_COMMUNITY): Admission: RE | Payer: Self-pay | Source: Home / Self Care

## 2022-11-01 SURGERY — LEFT HEART CATH AND CORONARY ANGIOGRAPHY
Anesthesia: LOCAL

## 2022-11-04 ENCOUNTER — Telehealth: Payer: Self-pay | Admitting: Cardiology

## 2022-11-04 ENCOUNTER — Ambulatory Visit: Payer: Managed Care, Other (non HMO) | Admitting: Cardiology

## 2022-11-04 DIAGNOSIS — I259 Chronic ischemic heart disease, unspecified: Secondary | ICD-10-CM

## 2022-11-04 DIAGNOSIS — I4891 Unspecified atrial fibrillation: Secondary | ICD-10-CM

## 2022-11-04 DIAGNOSIS — R0609 Other forms of dyspnea: Secondary | ICD-10-CM

## 2022-11-04 DIAGNOSIS — I209 Angina pectoris, unspecified: Secondary | ICD-10-CM

## 2022-11-04 NOTE — Telephone Encounter (Signed)
Delayed charting:   Spoke with patients daughter on 10/29/22 regarding changing referral to [facility] cardiology asked patients daughter to have patient to give [place] a call for clarity on the requested change in the location of the referral.

## 2022-11-04 NOTE — Telephone Encounter (Signed)
What betablocker do you want and how much as his heart rate is 141 at times?  Pt is aware we are moving to a cardiac CT

## 2022-11-04 NOTE — Telephone Encounter (Signed)
Pt would like a callback in regards to his appointment that was cancelled on 11/01/2022. Pt stated he was supposed to hear something back by Tuesday or Wednesday and he's concerned because he hadn't heard anything.

## 2022-11-08 MED ORDER — METOPROLOL TARTRATE 100 MG PO TABS: 100.0000 mg | ORAL_TABLET | Freq: Once | ORAL | 0 refills | Status: DC

## 2022-11-08 NOTE — Telephone Encounter (Signed)
Instructions sent via [place].

## 2022-11-08 NOTE — Telephone Encounter (Signed)
Called patient for clarity on requested referral by patients daughter. No answer LMTCB for clarification on referral.

## 2022-11-08 NOTE — Addendum Note (Signed)
Addended by: Truddie Hidden on: 11/08/2022 01:52 PM   Modules accepted: Orders

## 2022-11-11 ENCOUNTER — Ambulatory Visit: Payer: Managed Care, Other (non HMO) | Attending: Cardiology

## 2022-11-11 ENCOUNTER — Telehealth: Payer: Self-pay | Admitting: Family Medicine

## 2022-11-11 DIAGNOSIS — R0609 Other forms of dyspnea: Secondary | ICD-10-CM

## 2022-11-11 DIAGNOSIS — I4891 Unspecified atrial fibrillation: Secondary | ICD-10-CM | POA: Diagnosis not present

## 2022-11-11 DIAGNOSIS — I517 Cardiomegaly: Secondary | ICD-10-CM | POA: Diagnosis not present

## 2022-11-11 DIAGNOSIS — I081 Rheumatic disorders of both mitral and tricuspid valves: Secondary | ICD-10-CM | POA: Diagnosis not present

## 2022-11-11 LAB — ECHOCARDIOGRAM COMPLETE
Area-P 1/2: 6.96 cm2
Calc EF: 50 %
Est EF: 50
S' Lateral: 2.8 cm
Single Plane A2C EF: 52.5 %
Single Plane A4C EF: 42.7 %

## 2022-11-11 NOTE — Telephone Encounter (Signed)
Pt had a referral that was sent to [facility] by Dr Ethelene Hal on 10/06/22. He is would like this referral sent over to [facility] [phone]. Pt's wife, Earnest Bailey at [phone]

## 2022-11-12 ENCOUNTER — Other Ambulatory Visit: Payer: Self-pay

## 2022-11-12 ENCOUNTER — Telehealth: Payer: Self-pay | Admitting: Family Medicine

## 2022-11-12 DIAGNOSIS — I10 Essential (primary) hypertension: Secondary | ICD-10-CM

## 2022-11-12 DIAGNOSIS — R0609 Other forms of dyspnea: Secondary | ICD-10-CM

## 2022-11-12 NOTE — Telephone Encounter (Signed)
Harold Morgan called and said that she would like her husband to go to be referred  to interventional cardiologist at [facility] and his name and # is Harold Morgan [phone]

## 2022-11-12 NOTE — Telephone Encounter (Signed)
New referral place patient states that he has an appointment scheduled for 11/16/22 at [facility] [place] location. Fax [phone]

## 2022-11-15 ENCOUNTER — Telehealth: Payer: Self-pay | Admitting: Cardiology

## 2022-11-15 ENCOUNTER — Telehealth: Payer: Self-pay | Admitting: Family Medicine

## 2022-11-15 NOTE — Telephone Encounter (Signed)
Notes faxed to [facility] cardiology requested location on 11/11/22

## 2022-11-15 NOTE — Telephone Encounter (Signed)
The address is [address] [place] [address]

## 2022-11-15 NOTE — Telephone Encounter (Signed)
Pt daughter sumer [phone] called stated that her father need a cardiologist referral for her dad and the one they would like is [employer] [facility] . A [facility] cardiologist  Pt appt is tomorrow @ 9:00 . There fax is [phone] and the phone # is [phone]. Please call the patient daughter back

## 2022-11-15 NOTE — Telephone Encounter (Signed)
Daughter stated patient has appointment at [facility] tomorrow (4/9) and will need patient referral sent to Attn:  Dr. Louisa Second at [facility] [place], [facility], fax# [phone].  Ph#  [phone].  Daughter requested call back to confirm referral has been sent.

## 2022-11-15 NOTE — Telephone Encounter (Signed)
Spoke with patient who states that he has an appointment already scheduled with [facility] on 11/16/22 notes were faxed

## 2022-11-15 NOTE — Telephone Encounter (Signed)
Per phone note, reviewed encounters. PCP has already sent the referral to Dr. Katherine Mantle at [facility]. Left message for daughter Donette Larry. Advised to call if she needs further assistance.

## 2022-11-17 ENCOUNTER — Encounter: Payer: Self-pay | Admitting: Pulmonary Disease

## 2022-11-17 ENCOUNTER — Ambulatory Visit (INDEPENDENT_AMBULATORY_CARE_PROVIDER_SITE_OTHER): Payer: Managed Care, Other (non HMO) | Admitting: Pulmonary Disease

## 2022-11-17 VITALS — BP 132/78 | HR 67 | Ht 66.0 in | Wt 202.2 lb

## 2022-11-17 DIAGNOSIS — R0681 Apnea, not elsewhere classified: Secondary | ICD-10-CM | POA: Diagnosis not present

## 2022-11-17 NOTE — Progress Notes (Signed)
Harold Morgan    [id number]    1965/09/17  Primary Care Physician:Kremer, Talmadge Coventry, MD  Referring Physician: Mliss Sax, MD [address] [place],  [place] [address]  Chief complaint:   Patient with snoring, witnessed apneas  HPI:  Snoring, many years, witnessed apneas Gasping respirations at night Dryness of his mouth in the morning  Events occur almost nightly according to spouse Multiple gasping episodes at night  He wakes up in the morning feeling not rested  Sleepy during the day sometimes takes naps  He is a Airline pilot, short distance driving  Usually goes to bed between 11 and 12 Takes him about 5 minutes to fall asleep 3-4 awakenings Final wake up time about 5:30 in the morning  Weight has been stable  Never smoker  No family history of sleep apnea known to him, his mom did snore  Denies any significant headaches in the morning He does have some drainage at the back of his throat in the morning Does not have persistently stuffy nose  Has used medications in the past for reflux/GERD  He does have a history of atrial fibrillation He will be having a cardioversion procedure performed in the next week or so  Usually lays on his back Feels symptoms are worse when he is laying on his left side  Outpatient Encounter Medications as of 11/17/2022  Medication Sig   apixaban (ELIQUIS) 5 MG TABS tablet Take by mouth.   cholecalciferol (VITAMIN D3) 25 MCG (1000 UNIT) tablet Take 1,000 Units by mouth every evening.   [employer] ACID CONTROLLER 10 MG tablet TAKE 1 TABLET BY MOUTH EVERYDAY AT BEDTIME (Patient taking differently: Take 10 mg by mouth daily as needed for heartburn or indigestion.)   cyanocobalamin (VITAMIN B12) 1000 MCG tablet TAKE 1 TABLET BY MOUTH EVERY DAY   diclofenac (VOLTAREN) 75 MG EC tablet Take 1 tablet (75 mg total) by mouth 2 (two) times daily as needed. Take with food. (Patient taking differently: Take 75  mg by mouth every evening. Take with food.)   docusate sodium (COLACE) 100 MG capsule Take 1 capsule (100 mg total) by mouth 2 (two) times daily. (Patient taking differently: Take 100 mg by mouth daily as needed for moderate constipation.)   esomeprazole (NEXIUM) 40 MG capsule Take 40 mg by mouth every evening.   metoprolol succinate (TOPROL XL) 25 MG 24 hr tablet Take 1 tablet (25 mg total) by mouth daily. (Patient taking differently: Take 50 mg by mouth daily.)   nitroGLYCERIN (NITROSTAT) 0.4 MG SL tablet Place 1 tablet (0.4 mg total) under the tongue every 5 (five) minutes as needed.   Omega-3 Fatty Acids (FISH OIL) 1000 MG CAPS Take 1,000 mg by mouth daily.   PARoxetine (PAXIL) 40 MG tablet Take 1 tablet (40 mg total) by mouth every morning. (Patient taking differently: Take 40 mg by mouth every evening.)   Probiotic Product (PROBIOTIC DAILY PO) Take 1 capsule by mouth every evening.   rosuvastatin (CRESTOR) 40 MG tablet TAKE 1 TABLET(40 MG) BY MOUTH DAILY (Patient taking differently: Take 40 mg by mouth every evening.)   aspirin EC 81 MG tablet Take 1 tablet (81 mg total) by mouth daily. Swallow whole. (Patient not taking: Reported on 11/17/2022)   [DISCONTINUED] metoprolol tartrate (LOPRESSOR) 100 MG tablet Take 1 tablet (100 mg total) by mouth once for 1 dose. Take 2 hours prior to your CT if your heart rate is greater than  55   No facility-administered encounter medications on file as of 11/17/2022.    Allergies as of 11/17/2022 - Review Complete 11/17/2022  Allergen Reaction Noted   Codeine Itching 05/25/2021    Past Medical History:  Diagnosis Date   Anxiety    on meds   Arthritis    generalized   Arthritis of both hands 02/27/2020   Arthritis of carpometacarpal (CMC) joints of both thumbs 12/29/2021   B12 deficiency 01/30/2021   Benign prostatic hyperplasia with urinary hesitancy 12/11/2020   Bilateral hand numbness 12/29/2021   Chest pain 09/16/2022   Depression    on meds    DOE (dyspnea on exertion) 09/16/2022   Elevated LDL cholesterol level 02/27/2020   Essential hypertension 05/06/2020   Gastroesophageal reflux disease 01/30/2021   Heartburn    HLD (hyperlipidemia)    on meds   Kidney stone    Left leg pain 10/24/2017   Lower respiratory infection 02/27/2020   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   Nonintractable episodic headache 10/17/2017   Other microscopic hematuria 01/29/2021   Pain and swelling of right elbow 01/29/2021   Persistent depressive disorder 07/22/2022   Right testicular pain 07/07/2020   Screening for colon cancer 01/30/2021   Slow transit constipation 09/16/2022   Snores 11/12/2021   Urinary frequency 07/07/2020    Past Surgical History:  Procedure Laterality Date   LITHOTRIPSY     TONSILLECTOMY     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"    Family History  Problem Relation Age of Onset   Emphysema Father    Cancer Sister        lung caner, tobacco use   Aneurysm Maternal Uncle 40       brain   Bladder Cancer Neg Hx    Prostate cancer Neg Hx    Colon polyps Neg Hx    Colon cancer Neg Hx    Esophageal cancer Neg Hx    Rectal cancer Neg Hx     Social History   Socioeconomic History   Marital status: Married    Spouse name: Not on file   Number of children: 2   Years of education: Not on file   Highest education level: Not on file  Occupational History   Not on file  Tobacco Use   Smoking status: Never   Smokeless tobacco: Former    Types: Associate ProfessorChew  Vaping Use   Vaping Use: Never used  Substance and Sexual Activity   Alcohol use: Yes    Comment: special occasions   Drug use: No   Sexual activity: Yes  Other Topics Concern   Not on file  Social History Narrative   Not on file   Social Determinants of Health   Financial Resource Strain: Not on file  Food Insecurity: Not on file  Transportation Needs: Not on file  Physical Activity: Not on file  Stress: Not on file  Social  Connections: Not on file  Intimate Partner Violence: Not on file    Review of Systems  Respiratory:  Positive for choking.   Psychiatric/Behavioral:  Positive for sleep disturbance.     Vitals:   11/17/22 0845  BP: 132/78  Pulse: 67  SpO2: 98%     Physical Exam Constitutional:      Appearance: He is obese.  HENT:     Head: Normocephalic.     Mouth/Throat:     Mouth: Mucous membranes are moist.     Comments: Crowded oropharynx,  moist oral mucosa Eyes:     General: No scleral icterus.    Pupils: Pupils are equal, round, and reactive to light.  Cardiovascular:     Rate and Rhythm: Normal rate and regular rhythm.     Heart sounds: No murmur heard.    No friction rub.  Pulmonary:     Effort: No respiratory distress.     Breath sounds: No stridor. No wheezing or rhonchi.  Musculoskeletal:     Cervical back: No rigidity or tenderness.  Neurological:     Mental Status: He is alert.  Psychiatric:        Mood and Affect: Mood normal.       11/17/2022    8:00 AM  Results of the Epworth flowsheet  Sitting and reading 1  Watching TV 3  Sitting, inactive in a public place (e.g. a theatre or a meeting) 3  As a passenger in a car for an hour without a break 1  Lying down to rest in the afternoon when circumstances permit 3  Sitting and talking to someone 0  Sitting quietly after a lunch without alcohol 3  In a car, while stopped for a few minutes in traffic 0  Total score 14     Data Reviewed: No previous sleep study on record  Assessment:  Excessive daytime sleepiness  Moderate to significant probability of significant obstructive sleep apnea  Commercial driver  Pathophysiology of sleep disordered breathing discussed with the patient Treatment options discussed with the patient  Atrial fibrillation, hypertension, hyperlipidemia  Relationship between atrial fibrillation and treatment and untreated sleep apnea discussed  Plan/Recommendations: Risk with not  treating sleep apnea discussed  Encourage weight loss efforts  Encouraged enough hours of sleep at night  Will schedule patient for an in lab split-night study  Follow-up in about 3 months  Encouraged to call with significant concerns     Virl Diamond MD [facility] 11/17/2022, 9:15 AM  CC: Mliss Sax,*

## 2022-11-17 NOTE — Addendum Note (Signed)
Addended by: Lanna Poche on: 11/17/2022 09:47 AM   Modules accepted: Orders

## 2022-11-17 NOTE — Patient Instructions (Signed)
We will schedule you for split-night study  Stay safe  Always get about 6 to 8 hours of sleep every night  Safe driving [place] if you need to  I will see you in about 3 months  Good luck with your cardioversion procedure  Weight loss efforts  Living With Sleep Apnea Sleep apnea is a condition in which breathing pauses or becomes shallow during sleep. Sleep apnea is most commonly caused by a collapsed or blocked airway. People with sleep apnea usually snore loudly. They may have times when they gasp and stop breathing for 10 seconds or more during sleep. This may happen many times during the night. The breaks in breathing also interrupt the deep sleep that you need to feel rested. Even if you do not completely wake up from the gaps in breathing, your sleep may not be restful and you feel tired during the day. You may also have a headache in the morning and low energy during the day, and you may feel anxious or depressed. How can sleep apnea affect me? Sleep apnea increases your chances of extreme tiredness during the day (daytime fatigue). It can also increase your risk for health conditions, such as: Heart attack. Stroke. Obesity. Type 2 diabetes. Heart failure. Irregular heartbeat. High blood pressure. If you have daytime fatigue as a result of sleep apnea, you may be more likely to: Perform poorly at school or work. Fall asleep while driving. Have difficulty with attention. Develop depression or anxiety. Have sexual dysfunction. What actions can I take to manage sleep apnea? Sleep apnea treatment  If you were given a device to open your airway while you sleep, use it only as told by your health care provider. You may be given: An oral appliance. This is a custom-made mouthpiece that shifts your lower jaw forward. A continuous positive airway pressure (CPAP) device. This device blows air through a mask when you breathe out (exhale). A nasal expiratory positive airway pressure  (EPAP) device. This device has valves that you put into each nostril. A bi-level positive airway pressure (BIPAP) device. This device blows air through a mask when you breathe in (inhale) and breathe out (exhale). You may need surgery if other treatments do not work for you. Sleep habits Go to sleep and wake up at the same time every day. This helps set your internal clock (circadian rhythm) for sleeping. If you stay up later than usual, such as on weekends, try to get up in the morning within 2 hours of your normal wake time. Try to get at least 7-9 hours of sleep each night. Stop using a computer, tablet, and mobile phone a few hours before bedtime. Do not take long naps during the day. If you nap, limit it to 30 minutes. Have a relaxing bedtime routine. Reading or listening to music may relax you and help you sleep. Use your bedroom only for sleep. Keep your television and computer out of your bedroom. Keep your bedroom cool, dark, and quiet. Use a supportive mattress and pillows. Follow your health care provider's instructions for other changes to sleep habits. Nutrition Do not eat heavy meals in the evening. Do not have caffeine in the later part of the day. The effects of caffeine can last for more than 5 hours. Follow your health care provider's or dietitian's instructions for any diet changes. Lifestyle     Do not drink alcohol before bedtime. Alcohol can cause you to fall asleep at first, but then it can cause  you to wake up in the middle of the night and have trouble getting back to sleep. Do not use any products that contain nicotine or tobacco. These products include cigarettes, chewing tobacco, and vaping devices, such as e-cigarettes. If you need help quitting, ask your health care provider. Medicines Take over-the-counter and prescription medicines only as told by your health care provider. Do not use over-the-counter sleep medicine. You can become dependent on this  medicine, and it can make sleep apnea worse. Do not use medicines, such as sedatives and narcotics, unless told by your health care provider. Activity Exercise on most days, but avoid exercising in the evening. Exercising near bedtime can interfere with sleeping. If possible, spend time outside every day. Natural light helps regulate your circadian rhythm. General information Lose weight if you need to, and maintain a healthy weight. Keep all follow-up visits. This is important. If you are having surgery, make sure to tell your health care provider that you have sleep apnea. You may need to bring your device with you. Where to find more information Learn more about sleep apnea and daytime fatigue from: American Sleep Association: sleepassociation.org National Sleep Foundation: sleepfoundation.org National Heart, Lung, and Blood Institute: [employer] Summary Sleep apnea is a condition in which breathing pauses or becomes shallow during sleep. Sleep apnea can cause daytime fatigue and other serious health conditions. You may need to wear a device while sleeping to help keep your airway open. If you are having surgery, make sure to tell your health care provider that you have sleep apnea. You may need to bring your device with you. Making changes to sleep habits, diet, lifestyle, and activity can help you manage sleep apnea. This information is not intended to replace advice given to you by your health care provider. Make sure you discuss any questions you have with your health care provider. Document Revised: 03/04/2021 Document Reviewed: 07/04/2020 Elsevier Patient Education  2023 [employer].

## 2022-12-13 ENCOUNTER — Telehealth: Payer: Self-pay

## 2022-12-13 NOTE — Transitions of Care (Post Inpatient/ED Visit) (Signed)
 12/13/2022  Name: Harold Morgan MRN: [id number] DOB: [birth year]  Today's TOC FU Call Status: Today's TOC FU Call Status:: Successful TOC FU Call Competed TOC FU Call Complete Date: 12/13/22  Transition Care Management Follow-up Telephone Call Date of Discharge: 12/11/22 Discharge Facility: Other ([facility]) Name [facility] ([facility]) [facility] Facility: [facility] Type of Discharge: Inpatient Admission Primary Inpatient Discharge Diagnosis:: AFib How have you been since you were released from the hospital?: Better Any questions or concerns?: No  Items Reviewed: Did you receive and understand the discharge instructions provided?: Yes Medications obtained,verified, and reconciled?: Yes (Medications Reviewed) Any new allergies since your discharge?: No Dietary orders reviewed?: Yes Type of Diet Ordered:: low sodium/heart healthy Do you have support at home?: Yes People in Home: spouse  Medications Reviewed Today: Medications Reviewed Today     Reviewed by Anslie Spadafora, Jordan Hawks, CMA (Certified Medical Assistant) on 12/13/22 at 1559  Med List Status: <None>   Medication Order Taking? Sig Documenting Provider Last Dose Status Informant  apixaban (ELIQUIS) 5 MG TABS tablet [id number] Yes Take by mouth. [provider] Taking Active   aspirin EC 81 MG tablet [id number] No Take 1 tablet (81 mg total) by mouth daily. Swallow whole.  Patient not taking: Reported on 11/17/2022   Revankar, Aundra Dubin, MD Not Taking Active Spouse/Significant Other  cholecalciferol (VITAMIN D3) 25 MCG (1000 UNIT) tablet [id number] Yes Take 1,000 Units by mouth every evening. [provider] Taking Active Spouse/Significant Other  [employer]S ACID CONTROLLER 10 MG tablet [id number] Yes TAKE 1 TABLET BY MOUTH EVERYDAY AT BEDTIME  Patient taking differently: Take 10 mg by mouth daily as needed for heartburn or indigestion.   Mliss Sax, MD Taking Active Spouse/Significant Other   cyanocobalamin (VITAMIN B12) 1000 MCG tablet [id number] Yes TAKE 1 TABLET BY MOUTH EVERY DAY Mliss Sax, MD Taking Active Spouse/Significant Other  diclofenac (VOLTAREN) 75 MG EC tablet [id number] Yes Take 1 tablet (75 mg total) by mouth 2 (two) times daily as needed. Take with food.  Patient taking differently: Take 75 mg by mouth every evening. Take with food.   Mliss Sax, MD Taking Active Spouse/Significant Other  docusate sodium (COLACE) 100 MG capsule [id number] Yes Take 1 capsule (100 mg total) by mouth 2 (two) times daily.  Patient taking differently: Take 100 mg by mouth daily as needed for moderate constipation.   Mliss Sax, MD Taking Active Spouse/Significant Other  esomeprazole (NEXIUM) 40 MG capsule [id number] Yes Take 40 mg by mouth every evening. [provider] Taking Active Spouse/Significant Other           Med Note Gunnar Fusi, MELISSA R   Fri Oct 29, 2022 10:15 AM) OTC   metoprolol succinate (TOPROL XL) 25 MG 24 hr tablet [id number] No Take 1 tablet (25 mg total) by mouth daily.  Patient taking differently: Take 50 mg by mouth daily.   Revankar, Aundra Dubin, MD Unknown Active Spouse/Significant Other  nitroGLYCERIN (NITROSTAT) 0.4 MG SL tablet [id number] Yes Place 1 tablet (0.4 mg total) under the tongue every 5 (five) minutes as needed. Revankar, Aundra Dubin, MD Taking Active Spouse/Significant Other  Omega-3 Fatty Acids (FISH OIL) 1000 MG CAPS [id number] Yes Take 1,000 mg by mouth daily. [provider] Taking Active Spouse/Significant Other  PARoxetine (PAXIL) 40 MG tablet [id number] Yes Take 1 tablet (40 mg total) by mouth every morning.  Patient taking differently: Take 40 mg by mouth every evening.   Mliss Sax, MD Taking Active Spouse/Significant  Other  Probiotic Product (PROBIOTIC DAILY PO) [id number] Yes Take 1 capsule by mouth every evening. [provider] Taking Active Spouse/Significant Other  rosuvastatin  (CRESTOR) 40 MG tablet [id number] Yes TAKE 1 TABLET(40 MG) BY MOUTH DAILY  Patient taking differently: Take 40 mg by mouth every evening.   Mliss Sax, MD Taking Active Spouse/Significant Other            Home Care and Equipment/Supplies: Were Home Health Services Ordered?: NA Any new equipment or medical supplies ordered?: NA  Functional Questionnaire: Do you need assistance with bathing/showering or dressing?: No Do you need assistance with meal preparation?: No Do you need assistance with eating?: No Do you have difficulty maintaining continence: No Do you need assistance with getting out of bed/getting out of a chair/moving?: No Do you have difficulty managing or taking your medications?: No  Follow up appointments reviewed: PCP Follow-up appointment confirmed?: NA (has follow up w/ [facility] Cardiologist) Specialist [facility] Follow-up appointment confirmed?: Yes Date of Specialist follow-up appointment?: 12/31/22 Follow-Up Specialty Provider:: [facility] Dr.Harrison Do you need transportation to your follow-up appointment?: No Do you understand care options if your condition(s) worsen?: Yes-patient verbalized understanding  SDOH Interventions Today    Flowsheet Row Most Recent Value  SDOH Interventions   Transportation Interventions Intervention Not Indicated       SIGNATURE  Mackay Hanauer, CMA  [facility] AWV Team

## 2022-12-13 NOTE — Transitions of Care (Post Inpatient/ED Visit) (Signed)
 12/13/2022  Name: Harold Morgan MRN: [id number] DOB: 06/20/1966  Today's TOC FU Call Status: Today's TOC FU Call Status:: Successful TOC FU Call Competed TOC FU Call Complete Date: 12/13/22  Transition Care Management Follow-up Telephone Call Date of Discharge: 12/11/22 Discharge Facility: Other ([facility]) Name of Other ([facility]) Discharge Facility: [facility] Type of Discharge: Inpatient Admission Primary Inpatient Discharge Diagnosis:: AFib How have you been since you were released from the hospital?: Better Any questions or concerns?: No  Items Reviewed: Did you receive and understand the discharge instructions provided?: Yes Medications obtained,verified, and reconciled?: Yes (Medications Reviewed) Any new allergies since your discharge?: No Dietary orders reviewed?: Yes Type of Diet Ordered:: low sodium/heart healthy Do you have support at home?: Yes People in Home: spouse  Medications Reviewed Today: Medications Reviewed Today     Reviewed by Christin Mccreedy, Jordan Hawks, CMA (Certified Medical Assistant) on 12/13/22 at 1559  Med List Status: <None>   Medication Order Taking? Sig Documenting Provider Last Dose Status Informant  apixaban (ELIQUIS) 5 MG TABS tablet [id number] Yes Take by mouth. [provider] Taking Active   aspirin EC 81 MG tablet [id number] No Take 1 tablet (81 mg total) by mouth daily. Swallow whole.  Patient not taking: Reported on 11/17/2022   Revankar, Aundra Dubin, MD Not Taking Active Spouse/Significant Other  cholecalciferol (VITAMIN D3) 25 MCG (1000 UNIT) tablet [id number] Yes Take 1,000 Units by mouth every evening. [provider] Taking Active Spouse/Significant Other  [employer] ACID CONTROLLER 10 MG tablet [id number] Yes TAKE 1 TABLET BY MOUTH EVERYDAY AT BEDTIME  Patient taking differently: Take 10 mg by mouth daily as needed for heartburn or indigestion.   Mliss Sax, MD Taking Active Spouse/Significant Other   cyanocobalamin (VITAMIN B12) 1000 MCG tablet [id number] Yes TAKE 1 TABLET BY MOUTH EVERY DAY Mliss Sax, MD Taking Active Spouse/Significant Other  diclofenac (VOLTAREN) 75 MG EC tablet [id number] Yes Take 1 tablet (75 mg total) by mouth 2 (two) times daily as needed. Take with food.  Patient taking differently: Take 75 mg by mouth every evening. Take with food.   Mliss Sax, MD Taking Active Spouse/Significant Other  docusate sodium (COLACE) 100 MG capsule [id number] Yes Take 1 capsule (100 mg total) by mouth 2 (two) times daily.  Patient taking differently: Take 100 mg by mouth daily as needed for moderate constipation.   Mliss Sax, MD Taking Active Spouse/Significant Other  esomeprazole (NEXIUM) 40 MG capsule [id number] Yes Take 40 mg by mouth every evening. [provider] Taking Active Spouse/Significant Other           Med Note Gunnar Fusi, MELISSA R   Fri Oct 29, 2022 10:15 AM) OTC   metoprolol succinate (TOPROL XL) 25 MG 24 hr tablet [id number] No Take 1 tablet (25 mg total) by mouth daily.  Patient taking differently: Take 50 mg by mouth daily.   Revankar, Aundra Dubin, MD Unknown Active Spouse/Significant Other  nitroGLYCERIN (NITROSTAT) 0.4 MG SL tablet [id number] Yes Place 1 tablet (0.4 mg total) under the tongue every 5 (five) minutes as needed. Revankar, Aundra Dubin, MD Taking Active Spouse/Significant Other  Omega-3 Fatty Acids (FISH OIL) 1000 MG CAPS [id number] Yes Take 1,000 mg by mouth daily. [provider] Taking Active Spouse/Significant Other  PARoxetine (PAXIL) 40 MG tablet [id number] Yes Take 1 tablet (40 mg total) by mouth every morning.  Patient taking differently: Take 40 mg by mouth every evening.   Mliss Sax, MD Taking Active Spouse/Significant  Other  Probiotic Product (PROBIOTIC DAILY PO) [id number] Yes Take 1 capsule by mouth every evening. [provider] Taking Active Spouse/Significant Other  rosuvastatin  (CRESTOR) 40 MG tablet [id number] Yes TAKE 1 TABLET(40 MG) BY MOUTH DAILY  Patient taking differently: Take 40 mg by mouth every evening.   Mliss Sax, MD Taking Active Spouse/Significant Other            Home Care and Equipment/Supplies: N/A Were Home Health Services Ordered?: NA Any new equipment or medical supplies ordered?: NA  Functional Questionnaire:  Completed No needs Follow up appointments reviewed: PCP Follow-up appointment confirmed?: NA (has follow up w/ [facility] Cardiologist) Specialist [facility] Follow-up appointment confirmed?: Yes Date of Specialist follow-up appointment?: 05/24/24Yes, patient has follow up appointments with [facility] Cardiologist  SDOH Interventions Today    Flowsheet Row Most Recent Value  SDOH Interventions   Transportation Interventions Intervention Not Indicated       SIGNATURE  Kahdijah Errickson, CMA  [facility] AWV Team

## 2022-12-20 ENCOUNTER — Encounter: Payer: Self-pay | Admitting: Family Medicine

## 2023-01-18 ENCOUNTER — Other Ambulatory Visit: Payer: Self-pay | Admitting: Family Medicine

## 2023-01-18 DIAGNOSIS — F321 Major depressive disorder, single episode, moderate: Secondary | ICD-10-CM

## 2023-02-07 ENCOUNTER — Other Ambulatory Visit: Payer: Self-pay | Admitting: Family Medicine

## 2023-02-07 ENCOUNTER — Telehealth: Payer: Self-pay | Admitting: Pulmonary Disease

## 2023-02-07 DIAGNOSIS — K5901 Slow transit constipation: Secondary | ICD-10-CM

## 2023-02-07 NOTE — Telephone Encounter (Signed)
I called PT from recall list to sched an FU appt. He dcln appt. He said he had a cardiac ablation  done and "that solved my problem." Per Fredric Mare, please let Dr. Ashley Mariner this reason for not resched FU. TY.

## 2023-02-09 NOTE — Telephone Encounter (Signed)
Aware.  Encourage him to speak with his cardiologist as well, people with untreated sleep Apnea have a higher likelihood of going back in to Atrial fibrillation.  Will be available to help if he needs [place]

## 2023-02-09 NOTE — Telephone Encounter (Signed)
Routing message to Dr. Val Eagle as an Lorain Childes

## 2023-02-15 NOTE — Telephone Encounter (Signed)
Please call PT per Dr. Trena Platt notes.

## 2023-03-22 ENCOUNTER — Other Ambulatory Visit: Payer: Self-pay | Admitting: Family Medicine

## 2023-03-22 DIAGNOSIS — E538 Deficiency of other specified B group vitamins: Secondary | ICD-10-CM

## 2023-04-19 IMAGING — DX DG FINGER THUMB 2+V*R*
4 series · 4 of 4 positions shown · non-contrast
Comparison: None.

CLINICAL DATA: Right thumb pain

EXAM:
RIGHT THUMB 2+V

[dg finger thumb right (1 of 4)]
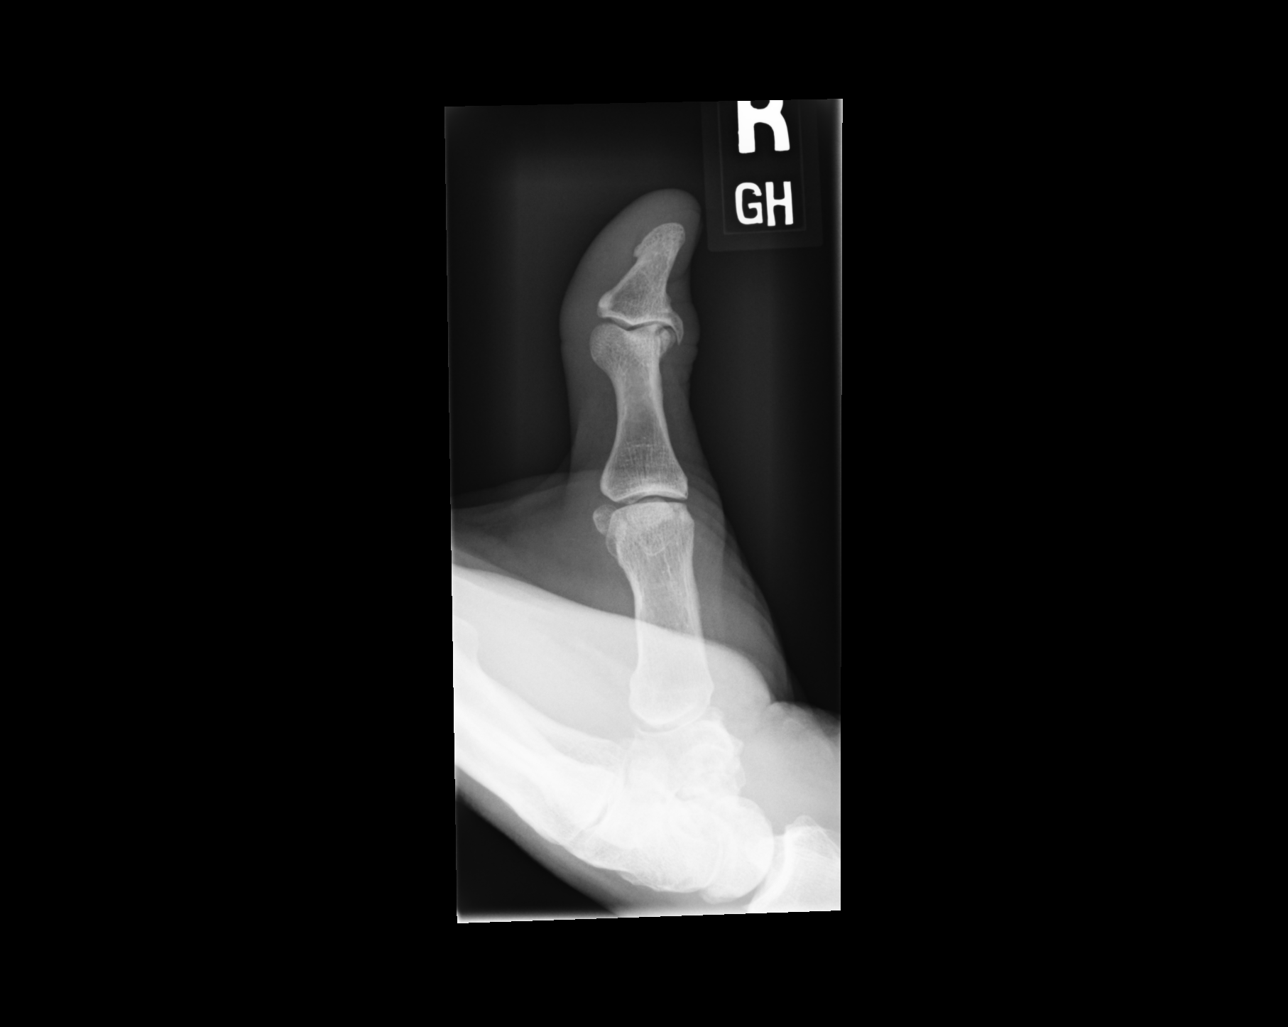

[dg finger thumb right (2 of 4)]
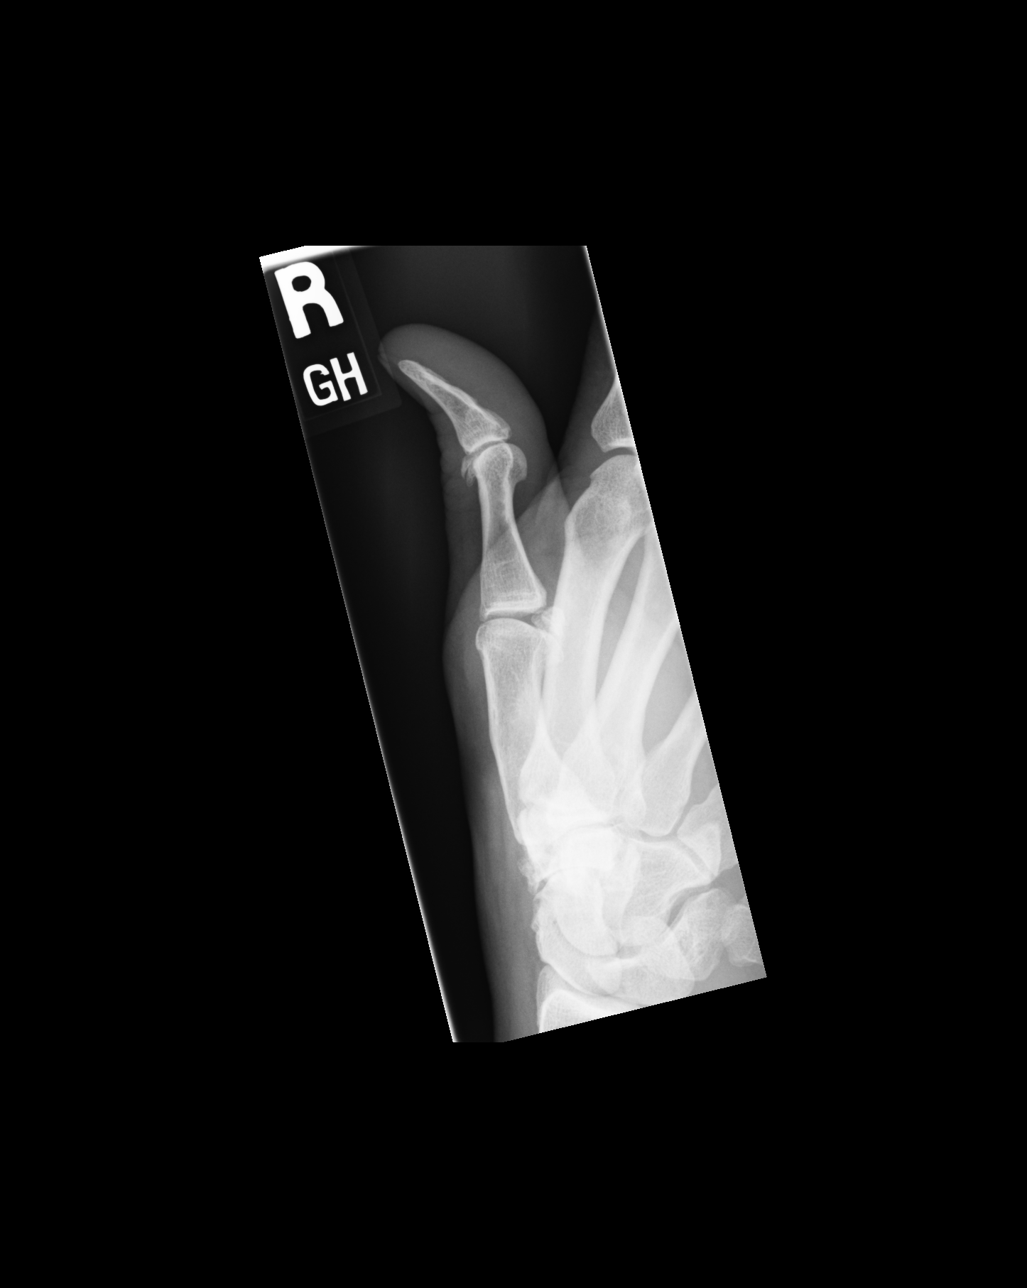

[dg finger thumb right (3 of 4)]
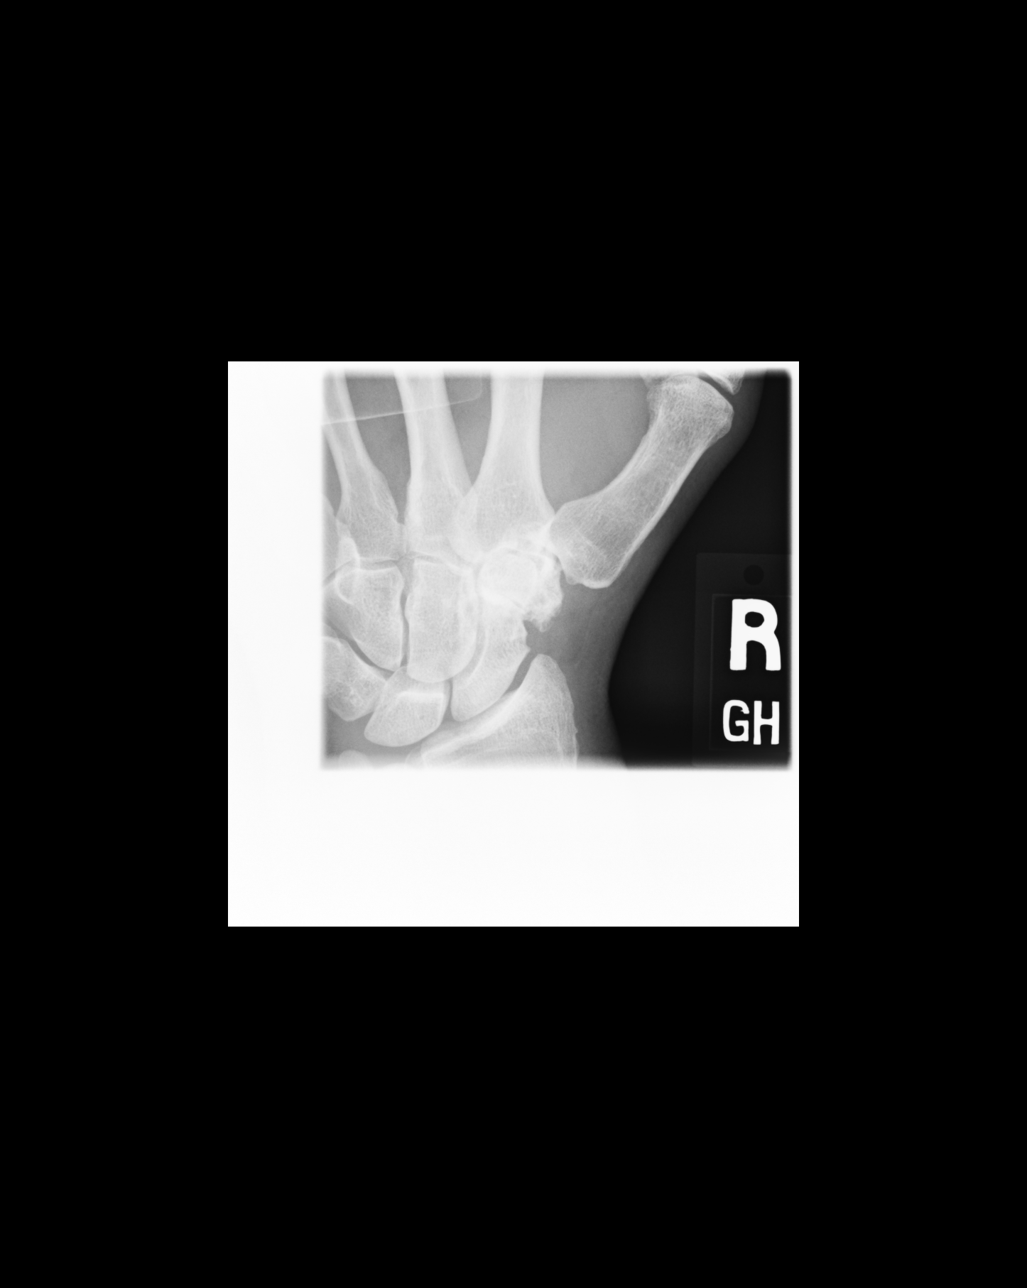

[dg finger thumb right (4 of 4)]
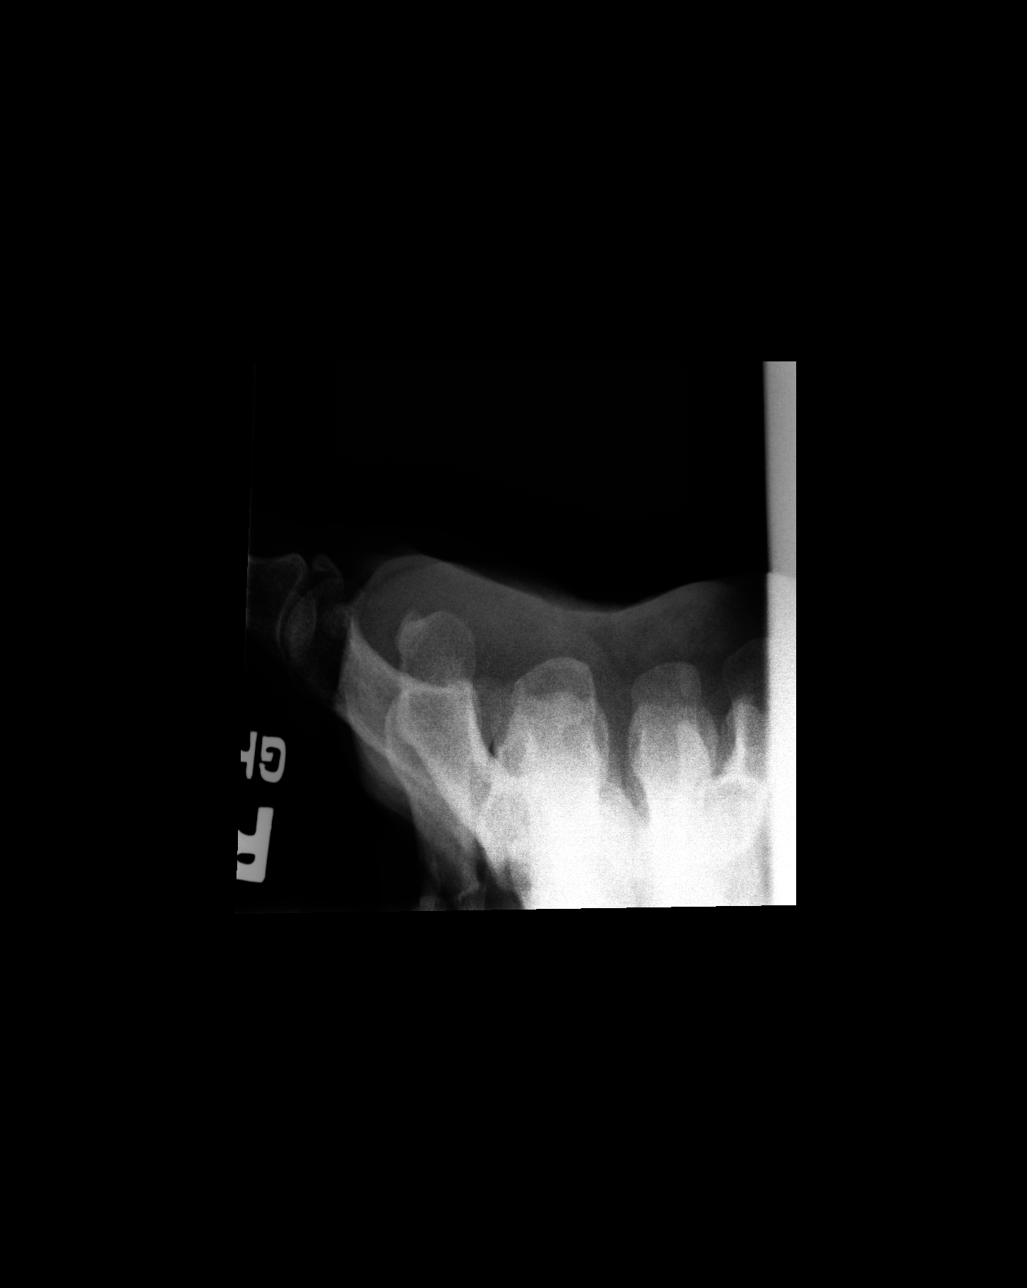

[4 of 4 positions shown; findings below may reference images not displayed]

FINDINGS: Normal alignment. No acute fracture or dislocation. Mild
interphalangeal, first carpometacarpal, and moderate triscaphe
degenerative arthritis. Soft tissues are unremarkable.
IMPRESSION: Multifocal degenerative change as outlined above.

## 2023-05-02 ENCOUNTER — Other Ambulatory Visit: Payer: Self-pay | Admitting: Family Medicine

## 2023-05-02 DIAGNOSIS — F321 Major depressive disorder, single episode, moderate: Secondary | ICD-10-CM

## 2023-05-11 ENCOUNTER — Encounter: Payer: Self-pay | Admitting: Family Medicine

## 2023-05-11 DIAGNOSIS — F341 Dysthymic disorder: Secondary | ICD-10-CM

## 2023-05-12 MED ORDER — PAROXETINE HCL 40 MG PO TABS: 40.0000 mg | ORAL_TABLET | Freq: Every evening | ORAL | 0 refills | Status: DC

## 2023-05-13 ENCOUNTER — Encounter: Payer: Self-pay | Admitting: Family Medicine

## 2023-05-13 ENCOUNTER — Other Ambulatory Visit: Payer: Self-pay | Admitting: Family Medicine

## 2023-05-13 DIAGNOSIS — Z1211 Encounter for screening for malignant neoplasm of colon: Secondary | ICD-10-CM

## 2023-05-13 DIAGNOSIS — Z1212 Encounter for screening for malignant neoplasm of rectum: Secondary | ICD-10-CM

## 2023-05-19 ENCOUNTER — Ambulatory Visit: Payer: Managed Care, Other (non HMO) | Admitting: Family Medicine

## 2023-06-11 ENCOUNTER — Other Ambulatory Visit: Payer: Self-pay | Admitting: Family Medicine

## 2023-06-11 DIAGNOSIS — F341 Dysthymic disorder: Secondary | ICD-10-CM

## 2023-06-20 ENCOUNTER — Encounter: Payer: Self-pay | Admitting: Family Medicine

## 2023-06-20 ENCOUNTER — Ambulatory Visit: Payer: Managed Care, Other (non HMO) | Admitting: Family Medicine

## 2023-06-20 VITALS — BP 132/84 | HR 96 | Temp 98.4°F | Ht 66.0 in | Wt 202.6 lb

## 2023-06-20 DIAGNOSIS — F341 Dysthymic disorder: Secondary | ICD-10-CM

## 2023-06-20 DIAGNOSIS — Z23 Encounter for immunization: Secondary | ICD-10-CM | POA: Diagnosis not present

## 2023-06-20 MED ORDER — PAROXETINE HCL 40 MG PO TABS: 40.0000 mg | ORAL_TABLET | Freq: Every evening | ORAL | 3 refills | Status: DC

## 2023-06-20 NOTE — Progress Notes (Signed)
Established Patient Office Visit   Subjective:  Patient ID: Harold Morgan, male    DOB: 08-Jun-1966  Age: 57 y.o. MRN: [id number]  Chief Complaint  Patient presents with   Depression    Rx refill for Paroxetine.     Depression        Associated symptoms include no myalgias.  Encounter Diagnoses  Name Primary?   Persistent depressive disorder Yes   Need for immunization against influenza    For follow-up of above.  Continues to do well with paroxetine.  Stress levels are improved.  Doing a better job handling the stress that remains in his life.  Active physically by walking his dog.   Review of Systems  Constitutional: Negative.   HENT: Negative.    Eyes:  Negative for blurred vision, discharge and redness.  Respiratory: Negative.    Cardiovascular: Negative.   Gastrointestinal:  Negative for abdominal pain.  Genitourinary: Negative.   Musculoskeletal: Negative.  Negative for myalgias.  Skin:  Negative for rash.  Neurological:  Negative for tingling, loss of consciousness and weakness.  Endo/Heme/Allergies:  Negative for polydipsia.  Psychiatric/Behavioral:  Positive for depression.       06/20/2023    3:46 PM 09/16/2022    8:10 AM 07/23/2022    8:13 AM  Depression screen PHQ 2/9  Decreased Interest 1 0 1  Down, Depressed, Hopeless 1 0 0  PHQ - 2 Score 2 0 1  Altered sleeping 1  2  Tired, decreased energy 1  2  Change in appetite 1  1  Feeling bad or failure about yourself  0  0  Trouble concentrating 1  0  Moving slowly or fidgety/restless 0  0  Suicidal thoughts 0  0  PHQ-9 Score 6  6  Difficult doing work/chores Not difficult at all Not difficult at all Not difficult at all       Current Outpatient Medications:    apixaban (ELIQUIS) 5 MG TABS tablet, Take by mouth., Disp: , Rfl:    aspirin EC 81 MG tablet, Take 1 tablet (81 mg total) by mouth daily. Swallow whole., Disp: 90 tablet, Rfl: 3   cholecalciferol (VITAMIN D3) 25 MCG (1000 UNIT) tablet, Take  1,000 Units by mouth every evening., Disp: , Rfl:    [employer] ACID CONTROLLER 10 MG tablet, TAKE 1 TABLET BY MOUTH EVERYDAY AT BEDTIME (Patient taking differently: Take 10 mg by mouth daily as needed for heartburn or indigestion.), Disp: 90 tablet, Rfl: 1   cyanocobalamin (VITAMIN B12) 1000 MCG tablet, TAKE 1 TABLET BY MOUTH EVERY DAY, Disp: 90 tablet, Rfl: 4   docusate sodium (COLACE) 100 MG capsule, Take 1 capsule (100 mg total) by mouth daily as needed for moderate constipation., Disp: 90 capsule, Rfl: 1   esomeprazole (NEXIUM) 40 MG capsule, Take 40 mg by mouth every evening., Disp: , Rfl:    metoprolol succinate (TOPROL XL) 25 MG 24 hr tablet, Take 1 tablet (25 mg total) by mouth daily. (Patient taking differently: Take 50 mg by mouth daily.), Disp: 90 tablet, Rfl: 3   nitroGLYCERIN (NITROSTAT) 0.4 MG SL tablet, Place 1 tablet (0.4 mg total) under the tongue every 5 (five) minutes as needed., Disp: 25 tablet, Rfl: 6   Omega-3 Fatty Acids (FISH OIL) 1000 MG CAPS, Take 1,000 mg by mouth daily., Disp: , Rfl:    PARoxetine (PAXIL) 40 MG tablet, Take 1 tablet (40 mg total) by mouth every evening., Disp: 90 tablet, Rfl: 3   Probiotic Product (  PROBIOTIC DAILY PO), Take 1 capsule by mouth every evening., Disp: , Rfl:    rosuvastatin (CRESTOR) 40 MG tablet, TAKE 1 TABLET(40 MG) BY MOUTH DAILY (Patient taking differently: Take 40 mg by mouth every evening.), Disp: 90 tablet, Rfl: 1   Objective:     BP 132/84   Pulse 96   Temp 98.4 F (36.9 C)   Ht 5\' 6"  (1.676 m)   Wt 202 lb 9.6 oz (91.9 kg)   SpO2 97%   BMI 32.70 kg/m    Physical Exam Constitutional:      General: He is not in acute distress.    Appearance: Normal appearance. He is not ill-appearing, toxic-appearing or diaphoretic.  HENT:     Head: Normocephalic and atraumatic.     Right Ear: External ear normal.     Left Ear: External ear normal.  Eyes:     General: No scleral icterus.       Right eye: No discharge.        Left eye: No  discharge.     Extraocular Movements: Extraocular movements intact.     Conjunctiva/sclera: Conjunctivae normal.  Cardiovascular:     Rate and Rhythm: Normal rate and regular rhythm.  Pulmonary:     Effort: Pulmonary effort is normal. No respiratory distress.     Breath sounds: No wheezing, rhonchi or rales.  Skin:    General: Skin is warm and dry.  Neurological:     Mental Status: He is alert and oriented to person, place, and time.  Psychiatric:        Mood and Affect: Mood normal.        Behavior: Behavior normal.      No results found for any visits on 06/20/23.    The 10-year ASCVD risk score (Arnett DK, et al., 2019) is: 7.9%    Assessment & Plan:   Persistent depressive disorder -     PARoxetine HCl; Take 1 tablet (40 mg total) by mouth every evening.  Dispense: 90 tablet; Refill: 3  Need for immunization against influenza -     Flu vaccine trivalent PF, 6mos and older(Flulaval,Afluria,Fluarix,Fluzone)    Return in about 6 months (around 12/18/2023) for annual physical.    Mliss Sax, MD

## 2024-04-01 ENCOUNTER — Other Ambulatory Visit: Payer: Self-pay | Admitting: Family Medicine

## 2024-04-01 DIAGNOSIS — F341 Dysthymic disorder: Secondary | ICD-10-CM
